# Patient Record
Sex: Female | Born: 1965 | Race: Black or African American | Hispanic: No | Marital: Married | Smoking: Never smoker
Health system: Southern US, Community
[De-identification: ages and names within clinical notes are randomized; demographics above are authoritative.]

## PROBLEM LIST (undated history)

## (undated) DIAGNOSIS — K219 Gastro-esophageal reflux disease without esophagitis: Secondary | ICD-10-CM

## (undated) DIAGNOSIS — J45909 Unspecified asthma, uncomplicated: Secondary | ICD-10-CM

## (undated) DIAGNOSIS — T7840XA Allergy, unspecified, initial encounter: Secondary | ICD-10-CM

## (undated) DIAGNOSIS — I10 Essential (primary) hypertension: Secondary | ICD-10-CM

## (undated) DIAGNOSIS — E041 Nontoxic single thyroid nodule: Secondary | ICD-10-CM

## (undated) DIAGNOSIS — D219 Benign neoplasm of connective and other soft tissue, unspecified: Secondary | ICD-10-CM

## (undated) HISTORY — DX: Essential (primary) hypertension: I10

## (undated) HISTORY — DX: Unspecified asthma, uncomplicated: J45.909

## (undated) HISTORY — DX: Benign neoplasm of connective and other soft tissue, unspecified: D21.9

## (undated) HISTORY — DX: Allergy, unspecified, initial encounter: T78.40XA

---

## 2011-04-30 ENCOUNTER — Ambulatory Visit (INDEPENDENT_AMBULATORY_CARE_PROVIDER_SITE_OTHER): Payer: Managed Care, Other (non HMO)

## 2011-04-30 DIAGNOSIS — R1084 Generalized abdominal pain: Secondary | ICD-10-CM

## 2011-04-30 DIAGNOSIS — K5732 Diverticulitis of large intestine without perforation or abscess without bleeding: Secondary | ICD-10-CM

## 2011-05-23 ENCOUNTER — Other Ambulatory Visit: Payer: Self-pay | Admitting: Family Medicine

## 2011-05-23 MED ORDER — LEVOCETIRIZINE DIHYDROCHLORIDE 5 MG PO TABS: 5.0000 mg | ORAL_TABLET | Freq: Every evening | ORAL | Status: DC

## 2011-05-23 MED ORDER — VALSARTAN-HYDROCHLOROTHIAZIDE 160-25 MG PO TABS: 1.0000 | ORAL_TABLET | Freq: Every day | ORAL | Status: DC

## 2011-05-24 ENCOUNTER — Telehealth: Payer: Self-pay | Admitting: Physician Assistant

## 2011-05-24 MED ORDER — VALSARTAN-HYDROCHLOROTHIAZIDE 160-25 MG PO TABS: 1.0000 | ORAL_TABLET | Freq: Every day | ORAL | Status: DC

## 2011-05-24 NOTE — Telephone Encounter (Signed)
prescription

## 2011-05-25 ENCOUNTER — Ambulatory Visit (INDEPENDENT_AMBULATORY_CARE_PROVIDER_SITE_OTHER): Payer: Managed Care, Other (non HMO) | Admitting: Family Medicine

## 2011-05-25 ENCOUNTER — Encounter: Payer: Self-pay | Admitting: Family Medicine

## 2011-05-25 DIAGNOSIS — N390 Urinary tract infection, site not specified: Secondary | ICD-10-CM

## 2011-05-25 DIAGNOSIS — T753XXA Motion sickness, initial encounter: Secondary | ICD-10-CM

## 2011-05-25 DIAGNOSIS — H6090 Unspecified otitis externa, unspecified ear: Secondary | ICD-10-CM

## 2011-05-25 DIAGNOSIS — R350 Frequency of micturition: Secondary | ICD-10-CM

## 2011-05-25 DIAGNOSIS — H833X9 Noise effects on inner ear, unspecified ear: Secondary | ICD-10-CM

## 2011-05-25 LAB — POCT URINALYSIS DIPSTICK
Ketones, UA: NEGATIVE
Protein, UA: NEGATIVE
Spec Grav, UA: 1.025
pH, UA: 5.5

## 2011-05-25 LAB — POCT UA - MICROSCOPIC ONLY
Casts, Ur, LPF, POC: NEGATIVE
Mucus, UA: NEGATIVE
Yeast, UA: NEGATIVE

## 2011-05-25 MED ORDER — SCOPOLAMINE 1 MG/3DAYS TD PT72: 1.0000 | MEDICATED_PATCH | TRANSDERMAL | Status: AC

## 2011-05-25 MED ORDER — NITROFURANTOIN MONOHYD MACRO 100 MG PO CAPS: 100.0000 mg | ORAL_CAPSULE | Freq: Two times a day (BID) | ORAL | Status: AC

## 2011-05-25 MED ORDER — CIPROFLOXACIN-HYDROCORTISONE 0.2-1 % OT SUSP: 3.0000 [drp] | Freq: Two times a day (BID) | OTIC | Status: AC

## 2011-05-25 NOTE — Progress Notes (Signed)
  Subjective:    Patient ID: Dorothy Baker, female    DOB: [birth year], 46 y.o.   MRN: [id number]  Urinary Tract Infection  This is a new problem. The current episode started in the past 7 days. The problem occurs intermittently. The problem has been gradually worsening. The quality of the pain is described as aching. The pain is mild. Associated symptoms include frequency. Pertinent negatives include no chills, discharge, flank pain, hematuria, hesitancy, nausea, sweats, urgency or vomiting. She has tried nothing for the symptoms. The treatment provided no relief. There is no history of recurrent UTIs.  Otalgia  There is pain in the left ear. This is a new problem. The current episode started in the past 7 days. The problem occurs every few minutes. The problem has been unchanged. There has been no fever. The fever has been present for 3 to 4 days. The pain is mild. Associated symptoms include hearing loss. Pertinent negatives include no abdominal pain, coughing, diarrhea, drainage, ear discharge, headaches, neck pain, rash, rhinorrhea, sore throat or vomiting.   Recent URI   Review of Systems  Constitutional: Negative for chills.  HENT: Positive for hearing loss and ear pain. Negative for sore throat, rhinorrhea, neck pain and ear discharge.   Respiratory: Negative for cough.   Gastrointestinal: Negative for nausea, vomiting, abdominal pain and diarrhea.  Genitourinary: Positive for frequency. Negative for hesitancy, urgency, hematuria and flank pain.  Skin: Negative for rash.  Neurological: Negative for headaches.       Objective:   Physical Exam  Constitutional: She is oriented to person, place, and time. She appears well-developed and well-nourished.  HENT:  Head: Normocephalic.       Left external ear canal is narrowed, inflammed. Pt able to pass whisper test  Eyes: EOM are normal. Pupils are equal, round, and reactive to light.  Neck: Normal range of motion. Neck supple.    Cardiovascular: Normal rate, regular rhythm and normal heart sounds.   Pulmonary/Chest: Effort normal and breath sounds normal.  Abdominal: Soft. Bowel sounds are normal.  Musculoskeletal: Normal range of motion.  Neurological: She is alert and oriented to person, place, and time.  Skin: Skin is warm.          Assessment & Plan:  1. UTI- urine cx pending, + leuks, - nitrites, Rx Macrobid 2. Left otitis externa-Pt has been [utting frops in ear canal and now has more pressure and narrowing of external canal. Gave her Cipro HC drops in Left ear.  3. Motion sickness-going on cruise will give rx for scopolamine patch.

## 2011-05-27 LAB — URINE CULTURE: Colony Count: 15000

## 2011-10-07 ENCOUNTER — Ambulatory Visit: Payer: Managed Care, Other (non HMO)

## 2011-10-23 ENCOUNTER — Other Ambulatory Visit: Payer: Self-pay | Admitting: Physician Assistant

## 2011-10-23 MED ORDER — LEVOCETIRIZINE DIHYDROCHLORIDE 5 MG PO TABS: 5.0000 mg | ORAL_TABLET | Freq: Every evening | ORAL | Status: DC

## 2012-03-12 ENCOUNTER — Other Ambulatory Visit: Payer: Self-pay | Admitting: Family Medicine

## 2012-03-12 DIAGNOSIS — Z1231 Encounter for screening mammogram for malignant neoplasm of breast: Secondary | ICD-10-CM

## 2012-03-31 ENCOUNTER — Ambulatory Visit
Admission: RE | Admit: 2012-03-31 | Discharge: 2012-03-31 | Disposition: A | Discharge status: Home / Self Care | Payer: Managed Care, Other (non HMO) | Source: Ambulatory Visit | Attending: Family Medicine | Admitting: Family Medicine

## 2012-03-31 DIAGNOSIS — Z1231 Encounter for screening mammogram for malignant neoplasm of breast: Secondary | ICD-10-CM

## 2014-12-24 DIAGNOSIS — J4550 Severe persistent asthma, uncomplicated: Secondary | ICD-10-CM | POA: Insufficient documentation

## 2014-12-24 DIAGNOSIS — J339 Nasal polyp, unspecified: Secondary | ICD-10-CM | POA: Insufficient documentation

## 2014-12-24 DIAGNOSIS — J309 Allergic rhinitis, unspecified: Secondary | ICD-10-CM | POA: Insufficient documentation

## 2014-12-24 DIAGNOSIS — H1010 Acute atopic conjunctivitis, unspecified eye: Secondary | ICD-10-CM | POA: Insufficient documentation

## 2014-12-24 DIAGNOSIS — J310 Chronic rhinitis: Secondary | ICD-10-CM | POA: Insufficient documentation

## 2014-12-24 DIAGNOSIS — T7800XA Anaphylactic reaction due to unspecified food, initial encounter: Secondary | ICD-10-CM | POA: Insufficient documentation

## 2014-12-30 ENCOUNTER — Other Ambulatory Visit: Payer: Self-pay

## 2014-12-30 MED ORDER — OMALIZUMAB 150 MG ~~LOC~~ SOLR
375.0000 mg | SUBCUTANEOUS | Status: DC
  Administered 2015-01-19 – 2021-11-05 (×159): 375 mg via SUBCUTANEOUS

## 2015-01-19 ENCOUNTER — Ambulatory Visit (INDEPENDENT_AMBULATORY_CARE_PROVIDER_SITE_OTHER): Payer: Managed Care, Other (non HMO)

## 2015-01-19 DIAGNOSIS — J4550 Severe persistent asthma, uncomplicated: Secondary | ICD-10-CM

## 2015-02-03 ENCOUNTER — Encounter: Payer: Managed Care, Other (non HMO) | Admitting: Neurology

## 2015-02-03 NOTE — Progress Notes (Signed)
This encounter was created in error - please disregard.

## 2015-02-16 ENCOUNTER — Ambulatory Visit (INDEPENDENT_AMBULATORY_CARE_PROVIDER_SITE_OTHER): Payer: Managed Care, Other (non HMO)

## 2015-02-16 DIAGNOSIS — J4540 Moderate persistent asthma, uncomplicated: Secondary | ICD-10-CM | POA: Diagnosis not present

## 2015-03-02 ENCOUNTER — Ambulatory Visit (INDEPENDENT_AMBULATORY_CARE_PROVIDER_SITE_OTHER): Payer: Managed Care, Other (non HMO)

## 2015-03-02 DIAGNOSIS — J4540 Moderate persistent asthma, uncomplicated: Secondary | ICD-10-CM

## 2015-03-14 ENCOUNTER — Ambulatory Visit (INDEPENDENT_AMBULATORY_CARE_PROVIDER_SITE_OTHER): Payer: Managed Care, Other (non HMO) | Admitting: *Deleted

## 2015-03-14 DIAGNOSIS — J4540 Moderate persistent asthma, uncomplicated: Secondary | ICD-10-CM

## 2015-03-20 ENCOUNTER — Other Ambulatory Visit: Payer: Self-pay | Admitting: Neurology

## 2015-03-20 MED ORDER — ALBUTEROL SULFATE HFA 108 (90 BASE) MCG/ACT IN AERS: 2.0000 | INHALATION_SPRAY | Freq: Four times a day (QID) | RESPIRATORY_TRACT | Status: DC | PRN

## 2015-03-21 ENCOUNTER — Other Ambulatory Visit: Payer: Self-pay

## 2015-03-21 MED ORDER — ALBUTEROL SULFATE HFA 108 (90 BASE) MCG/ACT IN AERS: 2.0000 | INHALATION_SPRAY | Freq: Four times a day (QID) | RESPIRATORY_TRACT | Status: DC | PRN

## 2015-03-22 ENCOUNTER — Other Ambulatory Visit: Payer: Self-pay | Admitting: Neurology

## 2015-03-22 MED ORDER — MONTELUKAST SODIUM 10 MG PO TABS: 10.0000 mg | ORAL_TABLET | Freq: Every day | ORAL | Status: DC

## 2015-03-23 ENCOUNTER — Other Ambulatory Visit: Payer: Self-pay | Admitting: Neurology

## 2015-03-23 MED ORDER — MONTELUKAST SODIUM 10 MG PO TABS: 10.0000 mg | ORAL_TABLET | Freq: Every day | ORAL | Status: DC

## 2015-03-24 ENCOUNTER — Other Ambulatory Visit: Payer: Self-pay | Admitting: Neurology

## 2015-03-24 MED ORDER — LEVOCETIRIZINE DIHYDROCHLORIDE 5 MG PO TABS: 5.0000 mg | ORAL_TABLET | Freq: Every evening | ORAL | Status: DC

## 2015-03-30 ENCOUNTER — Ambulatory Visit (INDEPENDENT_AMBULATORY_CARE_PROVIDER_SITE_OTHER): Payer: Managed Care, Other (non HMO)

## 2015-03-30 DIAGNOSIS — J4540 Moderate persistent asthma, uncomplicated: Secondary | ICD-10-CM

## 2015-04-13 ENCOUNTER — Ambulatory Visit (INDEPENDENT_AMBULATORY_CARE_PROVIDER_SITE_OTHER): Payer: Managed Care, Other (non HMO)

## 2015-04-13 DIAGNOSIS — J4540 Moderate persistent asthma, uncomplicated: Secondary | ICD-10-CM

## 2015-04-27 ENCOUNTER — Ambulatory Visit (INDEPENDENT_AMBULATORY_CARE_PROVIDER_SITE_OTHER): Payer: Managed Care, Other (non HMO)

## 2015-04-27 DIAGNOSIS — J4540 Moderate persistent asthma, uncomplicated: Secondary | ICD-10-CM

## 2015-05-11 ENCOUNTER — Ambulatory Visit (INDEPENDENT_AMBULATORY_CARE_PROVIDER_SITE_OTHER): Payer: Managed Care, Other (non HMO)

## 2015-05-11 DIAGNOSIS — J4540 Moderate persistent asthma, uncomplicated: Secondary | ICD-10-CM | POA: Diagnosis not present

## 2015-05-19 ENCOUNTER — Encounter: Payer: Self-pay | Admitting: Allergy and Immunology

## 2015-05-19 ENCOUNTER — Ambulatory Visit (INDEPENDENT_AMBULATORY_CARE_PROVIDER_SITE_OTHER): Payer: Managed Care, Other (non HMO) | Admitting: Allergy and Immunology

## 2015-05-19 VITALS — BP 120/80 | HR 76 | Temp 98.2°F | Resp 18

## 2015-05-19 DIAGNOSIS — H1010 Acute atopic conjunctivitis, unspecified eye: Secondary | ICD-10-CM

## 2015-05-19 DIAGNOSIS — J309 Allergic rhinitis, unspecified: Secondary | ICD-10-CM | POA: Diagnosis not present

## 2015-05-19 DIAGNOSIS — J4551 Severe persistent asthma with (acute) exacerbation: Secondary | ICD-10-CM | POA: Diagnosis not present

## 2015-05-19 MED ORDER — IPRATROPIUM BROMIDE 0.02 % IN SOLN
0.5000 mg | Freq: Once | RESPIRATORY_TRACT | Status: AC
  Administered 2015-05-19: 0.5 mg via RESPIRATORY_TRACT

## 2015-05-19 MED ORDER — LEVALBUTEROL HCL 1.25 MG/3ML IN NEBU
1.2500 mg | INHALATION_SOLUTION | Freq: Once | RESPIRATORY_TRACT | Status: AC
  Administered 2015-05-19: 1.25 mg via RESPIRATORY_TRACT

## 2015-05-19 MED ORDER — AEROCHAMBER PLUS MISC: Status: AC

## 2015-05-19 NOTE — Progress Notes (Signed)
FOLLOW UP NOTE  RE: Dorothy Baker MRN: [id number] DOB: Jun 02, 1965 [facility] [place] [address] [place] [address] Date of Office Visit: 05/19/2015  Subjective:  Dorothy Baker is a 50 y.o. female who presents today for Cough and Wheezing  Assessment:   1. Severe persistent asthma, with acute exacerbation, in no respiratory distress, responsive to bronchodilator.    2. Allergic rhinoconjunctivitis.   3.      History of nasal polyposis and aspirin hypersensitivity. 4.      Peanut allergy--avoidance and emergency action plan in place.   5.      Complex medical history. Plan:   Meds ordered this encounter  Medications  . ipratropium (ATROVENT) nebulizer solution 0.5 mg    Sig:   . levalbuterol (XOPENEX) nebulizer solution 1.25 mg    Sig:   . Spacer/Aero-Holding Chambers (AEROCHAMBER PLUS) inhaler    Sig: Use as directed with MDI    Dispense:  1 each    Refill:  2   Patient Instructions  1.  Begin prednisone  30mg  now and complete 30mg  daily over the next  3 days, then 20mg  for 2 days and 10mg  on last day. 2.  Use Symbicort 181mcg 2 Puffs twice daily with spacer. 3.  Ventolin HFA  2 puffs every 4 hours as needed--with spacer. 4.  Saline nasal wash 2-4 times daily. 5.  Continue Nasonex, Xyzal, Singulair daily. 6.  Xolair as scheduled. 7.  Epi-pen as needed. 8.  Call with any persisting symptoms or questions or concerns--reviewed 24-hour on-call physician availability throughout office. 9.  Follow-up in 4 Month(s) or sooner if needed.  HPI: Dorothy Baker returns to the office with report of cough and wheeze.  She reports her symptoms began in the last 2 days with slight increase today.  Therefore, today has used albuterol several times and notices chest congestion, tightness with increasing throat tickle, postnasal drip, and raspy voice.  She denies headache, sore throat or fever. Is sleeping very good and maintaining on her preventive regime  without issue, including Xolair one week ago.  There is mild nasal congestion, sneezing, but no discolored drainage or disrupted activity.   Denies ED or urgent care visits, prednisone or antibiotic courses. Reports sleep and activity are normal.  Dorothy Baker which includes the following prescription(s): albuterol, budesonide-formoterol, epinephrine, levocetirizine, mometasone, montelukast, omeprazole, sodium chloride, and valsartan-hydrochlorothiazide, and the following Facility-Administered Medications: omalizumab.   Drug Allergies: Allergies  Allergen Reactions  . Aspirin Shortness Of Breath and Cough    Asthma flair up  . Other Diarrhea and Nausea And Vomiting    Peanut  . Ciprofloxacin    Objective:   Filed Vitals:   05/19/15 1519  BP: 120/80  Pulse: 76  Temp: 98.2 F (36.8 C)  Resp: 18   SpO2 Readings from Last 1 Encounters:  05/19/15 95%   Physical Exam  Constitutional: She is well-developed, well-nourished, and in no distress.  Non-toxic appearance.  Alert interactive communicating easily in full sentences with intermittent throat clearing.  HENT:  Head: Atraumatic.  Right Ear: Tympanic membrane and ear canal normal.  Left Ear: Tympanic membrane and ear canal normal.  Nose: Mucosal edema and rhinorrhea (slight clear mucus bilaterally.) present. No epistaxis.  Mouth/Throat: Oropharynx is clear and moist and mucous membranes are normal. No oropharyngeal exudate, posterior oropharyngeal edema or posterior oropharyngeal erythema.  Neck: Neck supple.  Cardiovascular: Normal rate, S1 normal and S2 normal.   No murmur  heard. Pulmonary/Chest: Effort normal. No respiratory distress. She has wheezes (end expiratory wheeze with fair aeration.). She has no rhonchi. She has no rales.  Post Xopenex/Atrovent neb:  Improved aeration, without wheeze, rhonchi or crackles. Patient reports improved.    Lymphadenopathy:    She has no cervical adenopathy.    Diagnostics: Spirometry:  FVC 2.1 4--72% , FEV1 1.6 4--68% , FEF 25-75% 1.27-43%; significant improvement.  Postbronchodilator FVC 2.6 0--88%, FEV1 1.9 9--82 %, FEF 25--75% 1.55--52%.    Dorothy M. Ishmael Holter, MD  cc: Windell Hummingbird, PA-C

## 2015-05-19 NOTE — Patient Instructions (Addendum)
Begin prednisone  30mg  now and complete 30mg  daily over the next  3 days, then 20mg  for 2 days and 10mg  on last day.  Use  Symbicort 14mcg 2 Puffs twice daily.  Ventolin HFA  2 puffs every 4 hours as needed.  Saline nasal wash 2-4 times daily.  Continue Nasonex, Xyzal, Singulair daily.  Xolair as scheduled.  Epi-pen as needed.  Call with any persisting symptoms or questions or concerns.  Follow-up in  4 Month(s).

## 2015-05-25 ENCOUNTER — Ambulatory Visit (INDEPENDENT_AMBULATORY_CARE_PROVIDER_SITE_OTHER): Payer: Managed Care, Other (non HMO)

## 2015-05-25 DIAGNOSIS — J4540 Moderate persistent asthma, uncomplicated: Secondary | ICD-10-CM | POA: Diagnosis not present

## 2015-06-12 ENCOUNTER — Ambulatory Visit (INDEPENDENT_AMBULATORY_CARE_PROVIDER_SITE_OTHER): Payer: Managed Care, Other (non HMO)

## 2015-06-12 DIAGNOSIS — J4540 Moderate persistent asthma, uncomplicated: Secondary | ICD-10-CM | POA: Diagnosis not present

## 2015-06-29 ENCOUNTER — Ambulatory Visit (INDEPENDENT_AMBULATORY_CARE_PROVIDER_SITE_OTHER): Payer: Managed Care, Other (non HMO)

## 2015-06-29 DIAGNOSIS — J4540 Moderate persistent asthma, uncomplicated: Secondary | ICD-10-CM

## 2015-07-13 ENCOUNTER — Ambulatory Visit (INDEPENDENT_AMBULATORY_CARE_PROVIDER_SITE_OTHER): Payer: Managed Care, Other (non HMO)

## 2015-07-13 DIAGNOSIS — J4540 Moderate persistent asthma, uncomplicated: Secondary | ICD-10-CM

## 2015-07-27 ENCOUNTER — Ambulatory Visit (INDEPENDENT_AMBULATORY_CARE_PROVIDER_SITE_OTHER): Payer: Managed Care, Other (non HMO)

## 2015-07-27 DIAGNOSIS — J4540 Moderate persistent asthma, uncomplicated: Secondary | ICD-10-CM | POA: Diagnosis not present

## 2015-08-02 ENCOUNTER — Other Ambulatory Visit: Payer: Self-pay | Admitting: *Deleted

## 2015-08-02 MED ORDER — LEVOCETIRIZINE DIHYDROCHLORIDE 5 MG PO TABS: 5.0000 mg | ORAL_TABLET | Freq: Every evening | ORAL | Status: DC

## 2015-08-22 ENCOUNTER — Ambulatory Visit (INDEPENDENT_AMBULATORY_CARE_PROVIDER_SITE_OTHER): Payer: Managed Care, Other (non HMO)

## 2015-08-22 DIAGNOSIS — J4540 Moderate persistent asthma, uncomplicated: Secondary | ICD-10-CM | POA: Diagnosis not present

## 2015-08-24 ENCOUNTER — Other Ambulatory Visit: Payer: Self-pay

## 2015-08-24 DIAGNOSIS — Z1231 Encounter for screening mammogram for malignant neoplasm of breast: Secondary | ICD-10-CM

## 2015-09-08 ENCOUNTER — Ambulatory Visit (INDEPENDENT_AMBULATORY_CARE_PROVIDER_SITE_OTHER): Payer: Managed Care, Other (non HMO) | Admitting: *Deleted

## 2015-09-08 ENCOUNTER — Ambulatory Visit
Admission: RE | Admit: 2015-09-08 | Discharge: 2015-09-08 | Disposition: A | Discharge status: Home / Self Care | Payer: Managed Care, Other (non HMO) | Source: Ambulatory Visit

## 2015-09-08 DIAGNOSIS — J4540 Moderate persistent asthma, uncomplicated: Secondary | ICD-10-CM | POA: Diagnosis not present

## 2015-09-08 DIAGNOSIS — Z1231 Encounter for screening mammogram for malignant neoplasm of breast: Secondary | ICD-10-CM

## 2015-09-29 ENCOUNTER — Telehealth: Payer: Self-pay | Admitting: *Deleted

## 2015-09-29 ENCOUNTER — Ambulatory Visit (INDEPENDENT_AMBULATORY_CARE_PROVIDER_SITE_OTHER): Payer: Managed Care, Other (non HMO) | Admitting: *Deleted

## 2015-09-29 DIAGNOSIS — J4540 Moderate persistent asthma, uncomplicated: Secondary | ICD-10-CM | POA: Diagnosis not present

## 2015-09-29 NOTE — Telephone Encounter (Signed)
PAYING OFF MM BAL - THEN WILL START PAYING EPIC - SHE THINKS SHE PAID $500 USING FLEX CARD  - SHE WILL LET ME KNOW IF SHE FINDS IT

## 2015-09-29 NOTE — Telephone Encounter (Signed)
PT WOULD LIKE A RETURN CALL ABOUT HER BILL. PT STATES THAT SHE HAS MADE SEVERAL PAYMENTS THIS YEAR AND ITS NOT REFLECTING ON THE BILLS SHE KEEPS RECEIVING.

## 2015-10-12 ENCOUNTER — Ambulatory Visit (INDEPENDENT_AMBULATORY_CARE_PROVIDER_SITE_OTHER): Payer: Managed Care, Other (non HMO)

## 2015-10-12 DIAGNOSIS — J4540 Moderate persistent asthma, uncomplicated: Secondary | ICD-10-CM

## 2015-10-12 DIAGNOSIS — J309 Allergic rhinitis, unspecified: Secondary | ICD-10-CM

## 2015-10-26 ENCOUNTER — Other Ambulatory Visit: Payer: Self-pay | Admitting: *Deleted

## 2015-10-26 ENCOUNTER — Ambulatory Visit (INDEPENDENT_AMBULATORY_CARE_PROVIDER_SITE_OTHER): Payer: Managed Care, Other (non HMO)

## 2015-10-26 DIAGNOSIS — J4540 Moderate persistent asthma, uncomplicated: Secondary | ICD-10-CM

## 2015-10-26 MED ORDER — BUDESONIDE-FORMOTEROL FUMARATE 160-4.5 MCG/ACT IN AERO: 2.0000 | INHALATION_SPRAY | Freq: Two times a day (BID) | RESPIRATORY_TRACT | Status: DC

## 2015-11-20 ENCOUNTER — Ambulatory Visit (INDEPENDENT_AMBULATORY_CARE_PROVIDER_SITE_OTHER): Payer: Managed Care, Other (non HMO)

## 2015-11-20 DIAGNOSIS — J4540 Moderate persistent asthma, uncomplicated: Secondary | ICD-10-CM

## 2015-12-07 ENCOUNTER — Ambulatory Visit (INDEPENDENT_AMBULATORY_CARE_PROVIDER_SITE_OTHER): Payer: Managed Care, Other (non HMO)

## 2015-12-07 ENCOUNTER — Other Ambulatory Visit: Payer: Self-pay | Admitting: *Deleted

## 2015-12-07 DIAGNOSIS — J4540 Moderate persistent asthma, uncomplicated: Secondary | ICD-10-CM

## 2015-12-07 MED ORDER — MONTELUKAST SODIUM 10 MG PO TABS: 10.0000 mg | ORAL_TABLET | Freq: Every day | ORAL | 2 refills | Status: DC

## 2015-12-07 MED ORDER — LEVOCETIRIZINE DIHYDROCHLORIDE 5 MG PO TABS: 5.0000 mg | ORAL_TABLET | Freq: Every evening | ORAL | 3 refills | Status: DC

## 2015-12-21 ENCOUNTER — Ambulatory Visit: Payer: Self-pay

## 2015-12-28 ENCOUNTER — Ambulatory Visit (INDEPENDENT_AMBULATORY_CARE_PROVIDER_SITE_OTHER): Payer: Managed Care, Other (non HMO)

## 2015-12-28 DIAGNOSIS — J4540 Moderate persistent asthma, uncomplicated: Secondary | ICD-10-CM

## 2016-01-11 ENCOUNTER — Ambulatory Visit (INDEPENDENT_AMBULATORY_CARE_PROVIDER_SITE_OTHER): Payer: Managed Care, Other (non HMO)

## 2016-01-11 ENCOUNTER — Ambulatory Visit: Payer: Managed Care, Other (non HMO)

## 2016-01-11 DIAGNOSIS — J4540 Moderate persistent asthma, uncomplicated: Secondary | ICD-10-CM

## 2016-01-25 ENCOUNTER — Ambulatory Visit (INDEPENDENT_AMBULATORY_CARE_PROVIDER_SITE_OTHER): Payer: Managed Care, Other (non HMO) | Admitting: *Deleted

## 2016-01-25 DIAGNOSIS — J4540 Moderate persistent asthma, uncomplicated: Secondary | ICD-10-CM

## 2016-02-15 ENCOUNTER — Ambulatory Visit (INDEPENDENT_AMBULATORY_CARE_PROVIDER_SITE_OTHER): Payer: Managed Care, Other (non HMO) | Admitting: *Deleted

## 2016-02-15 DIAGNOSIS — J4540 Moderate persistent asthma, uncomplicated: Secondary | ICD-10-CM

## 2016-02-28 ENCOUNTER — Ambulatory Visit (INDEPENDENT_AMBULATORY_CARE_PROVIDER_SITE_OTHER): Payer: Managed Care, Other (non HMO) | Admitting: *Deleted

## 2016-02-28 DIAGNOSIS — J4540 Moderate persistent asthma, uncomplicated: Secondary | ICD-10-CM | POA: Diagnosis not present

## 2016-03-13 ENCOUNTER — Ambulatory Visit (INDEPENDENT_AMBULATORY_CARE_PROVIDER_SITE_OTHER): Payer: Managed Care, Other (non HMO) | Admitting: *Deleted

## 2016-03-13 DIAGNOSIS — J4540 Moderate persistent asthma, uncomplicated: Secondary | ICD-10-CM | POA: Diagnosis not present

## 2016-03-27 ENCOUNTER — Ambulatory Visit (INDEPENDENT_AMBULATORY_CARE_PROVIDER_SITE_OTHER): Payer: Managed Care, Other (non HMO) | Admitting: *Deleted

## 2016-03-27 DIAGNOSIS — J4540 Moderate persistent asthma, uncomplicated: Secondary | ICD-10-CM | POA: Diagnosis not present

## 2016-04-10 ENCOUNTER — Ambulatory Visit: Payer: Managed Care, Other (non HMO)

## 2016-04-11 ENCOUNTER — Ambulatory Visit (INDEPENDENT_AMBULATORY_CARE_PROVIDER_SITE_OTHER): Payer: Managed Care, Other (non HMO) | Admitting: *Deleted

## 2016-04-11 DIAGNOSIS — J4540 Moderate persistent asthma, uncomplicated: Secondary | ICD-10-CM

## 2016-04-24 ENCOUNTER — Ambulatory Visit (INDEPENDENT_AMBULATORY_CARE_PROVIDER_SITE_OTHER): Payer: Managed Care, Other (non HMO)

## 2016-04-24 ENCOUNTER — Ambulatory Visit: Payer: Managed Care, Other (non HMO)

## 2016-04-24 DIAGNOSIS — J4540 Moderate persistent asthma, uncomplicated: Secondary | ICD-10-CM | POA: Diagnosis not present

## 2016-05-10 ENCOUNTER — Ambulatory Visit (INDEPENDENT_AMBULATORY_CARE_PROVIDER_SITE_OTHER): Payer: Managed Care, Other (non HMO)

## 2016-05-10 DIAGNOSIS — J4540 Moderate persistent asthma, uncomplicated: Secondary | ICD-10-CM

## 2016-05-23 ENCOUNTER — Ambulatory Visit (INDEPENDENT_AMBULATORY_CARE_PROVIDER_SITE_OTHER): Payer: Managed Care, Other (non HMO)

## 2016-05-23 ENCOUNTER — Ambulatory Visit: Payer: Managed Care, Other (non HMO)

## 2016-05-23 DIAGNOSIS — J4540 Moderate persistent asthma, uncomplicated: Secondary | ICD-10-CM | POA: Diagnosis not present

## 2016-06-06 ENCOUNTER — Encounter: Payer: Self-pay | Admitting: Allergy

## 2016-06-06 ENCOUNTER — Ambulatory Visit (INDEPENDENT_AMBULATORY_CARE_PROVIDER_SITE_OTHER): Payer: Managed Care, Other (non HMO) | Admitting: Allergy

## 2016-06-06 VITALS — BP 110/68 | HR 70 | Temp 98.3°F | Resp 16 | Ht 65.5 in | Wt 225.2 lb

## 2016-06-06 DIAGNOSIS — J309 Allergic rhinitis, unspecified: Secondary | ICD-10-CM

## 2016-06-06 DIAGNOSIS — H6981 Other specified disorders of Eustachian tube, right ear: Secondary | ICD-10-CM

## 2016-06-06 DIAGNOSIS — H1010 Acute atopic conjunctivitis, unspecified eye: Secondary | ICD-10-CM

## 2016-06-06 DIAGNOSIS — J4550 Severe persistent asthma, uncomplicated: Secondary | ICD-10-CM

## 2016-06-06 MED ORDER — MOMETASONE FUROATE 50 MCG/ACT NA SUSP: 1.0000 | Freq: Two times a day (BID) | NASAL | 2 refills | Status: DC

## 2016-06-06 NOTE — Patient Instructions (Signed)
Eustachian tube dysfunction    - ear exam is normal without evidence of infection or fluid    - take prednisone 5 day course prescribed    - keep follow-up with Dr. Redmond Baseman  Asthma    - Continue Symbicort 110mcg 2 Puffs twice daily.    - Ventolin HFA  2 puffs every 4 hours as needed.    - Xolair every 2 weeks          - Epipen as needed   Asthma control goals:   Full participation in all desired activities (may need albuterol before activity)  Albuterol use two time or less a week on average (not counting use with activity)  Cough interfering with sleep two time or less a month  Oral steroids no more than once a year  No hospitalizations  Allergies   - Saline nasal wash 2-4 times daily as needed.   - Continue Nasonex, Xyzal, Singulair daily.   Call with any persisting symptoms or questions or concerns.  Follow-up in  6 Month(s).

## 2016-06-06 NOTE — Progress Notes (Signed)
Follow-up Note  RE: Dorothy Baker MRN: [id number] DOB: [birth year] Date of Office Visit: 06/06/2016   History of present illness: Dorothy Baker is a 51 y.o. female presenting today for follow-up of severe persistent asthma, allergic rhinoconjunctivitis, polyposis with aspirin hypersensitivity and food allergy. She was last seen in the office on October 17, 2015 by Dr. Ishmael Holter.  At that visit she had an acute exacerbation and treated with steroids.   Since this visit she has done well.  She has not had any further exacerbations, no oral steroids, no ED/UC visits or hospitalizations.  No nighttime awakenings. She gets Xolair injection every 2 weeks and does not have any local or systemic reactions.  She has access to Epipen.   She has noticed a big improvement on Xolair. She states she used to get 2-3 exacerbations requiring ED visits and/or hospitalizations; she has not had any hospitalizations since going on Xolair.    She presents today mostly as her R ear is clogged ut denies any chearing.  She has had this occur in the past and was advised by Dr. Redmond Baseman, her ENT, to increase her nasonex.  She was unable to get in to see Dr. Redmond Baseman until 06/13/15.  She started back using her Nasonex 2 sprays twice a day as recommended by Dr. Redmond Baseman. She reports she was doing just 1 spray twice a day.  She did report she ran out of her the nasonex and thinks that might be reason her ear is right clogged.  He denies any worsening of smell and she is able to taste.  She had polyps removed with Dr. Redmond Baseman several years ago who prescribed nasonex for her to use.  She saw him in follow-up about 1 year ago. .    Review of systems: Review of Systems  Constitutional: Negative for chills, fever and malaise/fatigue.  HENT: Negative for congestion, ear discharge, ear pain, hearing loss, sinus pain, sore throat and tinnitus.   Eyes: Negative for discharge and redness.  Respiratory: Negative for cough, shortness of breath and  wheezing.   Cardiovascular: Negative for chest pain.  Gastrointestinal: Negative for abdominal pain, heartburn, nausea and vomiting.  Musculoskeletal: Negative for joint pain and myalgias.  Skin: Negative for itching and rash.  Neurological: Negative for headaches.    All other systems negative unless noted above in HPI  Past medical/social/surgical/family history have been reviewed and are unchanged unless specifically indicated below.  No changes  Medication List: Allergies as of 06/06/2016      Reactions   Aspirin Shortness Of Breath, Cough   Asthma flair up   Other Diarrhea, Nausea And Vomiting   Peanut   Ciprofloxacin       Medication List       Accurate as of 06/06/16  4:46 PM. Always use your most recent med list.          AEROCHAMBER PLUS inhaler Use as directed with MDI   albuterol 108 (90 Base) MCG/ACT inhaler Commonly known as:  VENTOLIN HFA Inhale 2 puffs into the lungs every 6 (six) hours as needed for wheezing or shortness of breath.   amitriptyline 25 MG tablet Commonly known as:  ELAVIL Take 25 mg by mouth as needed.   budesonide-formoterol 160-4.5 MCG/ACT inhaler Commonly known as:  SYMBICORT Inhale 2 puffs into the lungs 2 (two) times daily.   EPIPEN 2-PAK 0.3 mg/0.3 mL Soaj injection Generic drug:  EPINEPHrine Inject 0.3 mg into the muscle once.   levocetirizine 5 MG tablet  Commonly known as:  XYZAL Take 1 tablet (5 mg total) by mouth every evening.   metFORMIN 1000 MG tablet Commonly known as:  GLUCOPHAGE Take 1,000 mg by mouth 2 (two) times daily with a meal. Reported on 05/19/2015   mometasone 50 MCG/ACT nasal spray Commonly known as:  NASONEX Place 1-2 sprays into the nose 2 (two) times daily.   montelukast 10 MG tablet Commonly known as:  SINGULAIR Take 1 tablet (10 mg total) by mouth at bedtime.   omeprazole 20 MG capsule Commonly known as:  PRILOSEC Take 20 mg by mouth daily.   phentermine 37.5 MG tablet Commonly known as:   ADIPEX-P Take 37.5 mg by mouth daily.   sodium chloride 0.65 % Soln nasal spray Commonly known as:  OCEAN Place 1 spray into both nostrils as needed for congestion.   valsartan-hydrochlorothiazide 160-25 MG tablet Commonly known as:  DIOVAN HCT Take 1 tablet by mouth daily.   valsartan-hydrochlorothiazide 160-12.5 MG tablet Commonly known as:  DIOVAN-HCT Take 160 tablets by mouth daily.   Vitamin D3 5000 units Caps Take 5,000 Units by mouth daily.       Known medication allergies: Allergies  Allergen Reactions  . Aspirin Shortness Of Breath and Cough    Asthma flair up  . Other Diarrhea and Nausea And Vomiting    Peanut  . Ciprofloxacin      Physical examination: Blood pressure 110/68, pulse 70, temperature 98.3 F (36.8 C), temperature source Oral, resp. rate 16, height 5' 5.5" (1.664 m), weight 225 lb 3.2 oz (102.2 kg), SpO2 98 %.  General: Alert, interactive, in no acute distress. HEENT: TMs pearly gray, no erythema of the canals and no evidence of fluid behind the Tm, turbinates mildly edematous without discharge, post-pharynx non erythematous. Neck: Supple without lymphadenopathy. Lungs: Clear to auscultation without wheezing, rhonchi or rales. {no increased work of breathing. CV: Normal S1, S2 without murmurs. Abdomen: Nondistended, nontender. Skin: Warm and dry, without lesions or rashes. Extremities:  No clubbing, cyanosis or edema. Neuro:   Grossly intact.  Diagnositics/Labs:  Spirometry: FEV1: 1.94L  83%, FVC: 3.1L  108%, ratio consistent with Nonobstructive pattern  Assessment and plan:   Eustachian tube dysfunction    - ear exam is normal without evidence of infection or fluid    - take prednisone 5 day course prescribed    - keep follow-up with Dr. Redmond Baseman  Asthma,  Severe persistent    - Continue Symbicort 140mcg 2 Puffs twice daily.    - Ventolin HFA  2 puffs every 4 hours as needed.    - Xolair every 2 weeks          - Epipen as needed    Asthma control goals:   Full participation in all desired activities (may need albuterol before activity)  Albuterol use two time or less a week on average (not counting use with activity)  Cough interfering with sleep two time or less a month  Oral steroids no more than once a year  No hospitalizations  Allergic rhiconjunctivitis   - Saline nasal wash 2-4 times daily as needed.   - Continue Nasonex, Xyzal, Singulair daily.   Call with any persisting symptoms or questions or concerns.  Follow-up in  6 Month(s).    No Follow-up on file.  I appreciate the opportunity to take part in Dorothy Baker's care. Please do not hesitate to contact me with questions.  Sincerely,   Prudy Feeler, MD Allergy/Immunology [facility]

## 2016-06-17 ENCOUNTER — Ambulatory Visit: Payer: Self-pay

## 2016-06-19 ENCOUNTER — Telehealth: Payer: Self-pay | Admitting: Allergy and Immunology

## 2016-06-19 ENCOUNTER — Ambulatory Visit (INDEPENDENT_AMBULATORY_CARE_PROVIDER_SITE_OTHER): Payer: Managed Care, Other (non HMO)

## 2016-06-19 DIAGNOSIS — J4550 Severe persistent asthma, uncomplicated: Secondary | ICD-10-CM

## 2016-06-19 NOTE — Telephone Encounter (Signed)
Pt came to pay on her account and was told it was $662.45 and wanted to know why so high after she has paid over $400.00 and she would like to get a print out and a phone call [phone]. Sherryle Lis [phone].

## 2016-06-19 NOTE — Telephone Encounter (Signed)
Pt's account has some ins credits - will contact [facility] billing & call pt tomorrow

## 2016-06-20 ENCOUNTER — Ambulatory Visit: Payer: Self-pay

## 2016-06-24 NOTE — Telephone Encounter (Signed)
Dorothy Baker in [place] billing fixed account - called pt & told her that her correct bal is $362.45

## 2016-07-03 ENCOUNTER — Ambulatory Visit: Payer: Managed Care, Other (non HMO)

## 2016-07-04 ENCOUNTER — Ambulatory Visit (INDEPENDENT_AMBULATORY_CARE_PROVIDER_SITE_OTHER): Payer: Managed Care, Other (non HMO) | Admitting: *Deleted

## 2016-07-04 ENCOUNTER — Ambulatory Visit: Payer: Managed Care, Other (non HMO)

## 2016-07-04 DIAGNOSIS — J4550 Severe persistent asthma, uncomplicated: Secondary | ICD-10-CM | POA: Diagnosis not present

## 2016-07-18 ENCOUNTER — Ambulatory Visit (INDEPENDENT_AMBULATORY_CARE_PROVIDER_SITE_OTHER): Payer: Managed Care, Other (non HMO) | Admitting: *Deleted

## 2016-07-18 DIAGNOSIS — J4540 Moderate persistent asthma, uncomplicated: Secondary | ICD-10-CM

## 2016-08-01 ENCOUNTER — Ambulatory Visit (INDEPENDENT_AMBULATORY_CARE_PROVIDER_SITE_OTHER): Payer: Managed Care, Other (non HMO) | Admitting: *Deleted

## 2016-08-01 DIAGNOSIS — J4540 Moderate persistent asthma, uncomplicated: Secondary | ICD-10-CM | POA: Diagnosis not present

## 2016-08-14 ENCOUNTER — Telehealth: Payer: Self-pay | Admitting: Allergy

## 2016-08-14 ENCOUNTER — Ambulatory Visit (INDEPENDENT_AMBULATORY_CARE_PROVIDER_SITE_OTHER): Payer: Managed Care, Other (non HMO) | Admitting: *Deleted

## 2016-08-14 DIAGNOSIS — J4540 Moderate persistent asthma, uncomplicated: Secondary | ICD-10-CM

## 2016-08-14 NOTE — Telephone Encounter (Signed)
patient would like a print out for the past year of the bills and payments she has made. please mail to the patient

## 2016-08-14 NOTE — Telephone Encounter (Signed)
Danielle will send printout to pt - kt

## 2016-08-28 ENCOUNTER — Ambulatory Visit: Payer: Managed Care, Other (non HMO)

## 2016-08-29 ENCOUNTER — Ambulatory Visit (INDEPENDENT_AMBULATORY_CARE_PROVIDER_SITE_OTHER): Payer: Managed Care, Other (non HMO) | Admitting: *Deleted

## 2016-08-29 DIAGNOSIS — J4540 Moderate persistent asthma, uncomplicated: Secondary | ICD-10-CM

## 2016-08-30 ENCOUNTER — Other Ambulatory Visit: Payer: Self-pay | Admitting: *Deleted

## 2016-08-30 DIAGNOSIS — Z1231 Encounter for screening mammogram for malignant neoplasm of breast: Secondary | ICD-10-CM

## 2016-09-12 ENCOUNTER — Ambulatory Visit (INDEPENDENT_AMBULATORY_CARE_PROVIDER_SITE_OTHER): Payer: Managed Care, Other (non HMO) | Admitting: *Deleted

## 2016-09-12 DIAGNOSIS — J4540 Moderate persistent asthma, uncomplicated: Secondary | ICD-10-CM | POA: Diagnosis not present

## 2016-09-13 ENCOUNTER — Ambulatory Visit
Admission: RE | Admit: 2016-09-13 | Discharge: 2016-09-13 | Disposition: A | Discharge status: Home / Self Care | Payer: Managed Care, Other (non HMO) | Source: Ambulatory Visit | Attending: *Deleted | Admitting: *Deleted

## 2016-09-13 DIAGNOSIS — Z1231 Encounter for screening mammogram for malignant neoplasm of breast: Secondary | ICD-10-CM

## 2016-09-26 ENCOUNTER — Ambulatory Visit (INDEPENDENT_AMBULATORY_CARE_PROVIDER_SITE_OTHER): Payer: Managed Care, Other (non HMO) | Admitting: *Deleted

## 2016-09-26 DIAGNOSIS — J4540 Moderate persistent asthma, uncomplicated: Secondary | ICD-10-CM | POA: Diagnosis not present

## 2016-10-10 ENCOUNTER — Ambulatory Visit (INDEPENDENT_AMBULATORY_CARE_PROVIDER_SITE_OTHER): Payer: Managed Care, Other (non HMO)

## 2016-10-10 DIAGNOSIS — J4540 Moderate persistent asthma, uncomplicated: Secondary | ICD-10-CM | POA: Diagnosis not present

## 2016-10-18 ENCOUNTER — Other Ambulatory Visit: Payer: Self-pay

## 2016-10-18 MED ORDER — MONTELUKAST SODIUM 10 MG PO TABS: 10.0000 mg | ORAL_TABLET | Freq: Every day | ORAL | 2 refills | Status: DC

## 2016-10-24 ENCOUNTER — Ambulatory Visit: Payer: Self-pay

## 2016-10-28 ENCOUNTER — Ambulatory Visit (INDEPENDENT_AMBULATORY_CARE_PROVIDER_SITE_OTHER): Payer: Managed Care, Other (non HMO) | Admitting: Allergy & Immunology

## 2016-10-28 ENCOUNTER — Ambulatory Visit (INDEPENDENT_AMBULATORY_CARE_PROVIDER_SITE_OTHER): Payer: Managed Care, Other (non HMO)

## 2016-10-28 ENCOUNTER — Encounter: Payer: Self-pay | Admitting: Allergy & Immunology

## 2016-10-28 VITALS — BP 112/76 | HR 75 | Temp 98.7°F

## 2016-10-28 DIAGNOSIS — J4540 Moderate persistent asthma, uncomplicated: Secondary | ICD-10-CM

## 2016-10-28 DIAGNOSIS — H938X1 Other specified disorders of right ear: Secondary | ICD-10-CM | POA: Diagnosis not present

## 2016-10-28 DIAGNOSIS — J4550 Severe persistent asthma, uncomplicated: Secondary | ICD-10-CM

## 2016-10-28 DIAGNOSIS — H1010 Acute atopic conjunctivitis, unspecified eye: Secondary | ICD-10-CM

## 2016-10-28 DIAGNOSIS — H6981 Other specified disorders of Eustachian tube, right ear: Secondary | ICD-10-CM | POA: Diagnosis not present

## 2016-10-28 DIAGNOSIS — J309 Allergic rhinitis, unspecified: Secondary | ICD-10-CM

## 2016-10-28 MED ORDER — MOMETASONE FUROATE 50 MCG/ACT NA SUSP: 1.0000 | Freq: Two times a day (BID) | NASAL | 3 refills | Status: DC

## 2016-10-28 MED ORDER — OXYMETAZOLINE HCL 0.05 % NA SOLN: 1.0000 | Freq: Two times a day (BID) | NASAL | 3 refills | Status: DC

## 2016-10-28 NOTE — Patient Instructions (Addendum)
1. Ear fullness with fluid behind the right TM - Start the prednisone pack provided today. - Consider using Afrin (oxymetazoline) one spray per nostril twice daily to help open things up and help the Eustachian tube drain.  - Only use the Afrin for 3-5 days at the most.  2. Return in about 3 months (around 01/28/2017).   Please inform us of any Emergency Department visits, hospitalizations, or changes in symptoms. Call us before going to the ED for breathing or allergy symptoms since we might be able to fit you in for a sick visit. Feel free to contact us anytime with any questions, problems, or concerns.  It was a pleasure to meet you today! Happy summer!   Websites that have reliable patient information: 1. American Academy of Asthma, Allergy, and Immunology: www.aaaai.org 2. Food Allergy Research and Education (FARE): foodallergy.org 3. Mothers of Asthmatics: http://www.asthmacommunitynetwork.org 4. American College of Allergy, Asthma, and Immunology: www.acaai.org

## 2016-10-28 NOTE — Progress Notes (Signed)
FOLLOW UP  Date of Service/Encounter:  10/28/16   Assessment:   Right ear fullness - with right OME   Severe persistent asthma, uncomplicated  Dysfunction of right eustachian tube  Seasonal allergic rhinitis   Asthma Reportables:  Severity: severe persistent  Risk: high Control: well controlled   Plan/Recommendations:   1. Ear fullness with fluid behind the right TM - Start the prednisone pack provided today. - Consider using Afrin (oxymetazoline) one spray per nostril twice daily to help open things up and help the Eustachian tube drain.  - Only use the Afrin for 3-5 days at the most.  2. Return in about 3 months (around 01/28/2017).  Subjective:   Dorothy Baker is a 51 y.o. female presenting today for follow up of  Chief Complaint  Patient presents with  . Follow-up    C/O not able to hear from right ear x 3 weeks.     Dorothy Baker has a history of the following: Patient Active Problem List   Diagnosis Date Noted  . Severe persistent asthma 12/24/2014  . Allergic rhinitis 12/24/2014  . Nasal polyposis 12/24/2014  . Allergic rhinoconjunctivitis 12/24/2014  . 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 Rhinitis 12/24/2014    History obtained from: chart review and patient.  Dorothy Baker was referred by Mancel Bale, PA-C.     Dorothy Baker is a 51yo female with a history of severe persistent asthma, ARC, and nasal polyposis with aspirin sensitivity and food allergy presenting for a follow up appointment. She was last seen in February 2018 by Dr. Nelva Bush. At that time, she was doing well on Xolair every two weeks. At the last visit, she was complaining about right ear fullness. She has already increased her Nasonex two sprays twice daily. She was started on a prednisone burst to help with her ear fullness. She was continued on Symbicort 160/4.5 two puffs twice daily as well as Xolair every weeks.   Since the last visit, she endorses right ear  fullness. This has been ongoing for three weeks. She has used her nasal sprays with minimal relief. She is using her nasal spray twice daily. She has had no fevers at all and there is no constant pain with this. The prednisone at the last visit might have worked, but she say Dr. Redmond Baseman in the meantime. She was told by Dr. Redmond Baseman to use nasal spray twice daily. She increased the nasal spray and it popped open once she got on a plane. She estimates that this happens around twice annually.   She has asthma and has been on Xolair for 6-7 years. This has helped significantly with her asthma and she is quite happy with how well she is doing. She remains on Symbicort 160/4.25 two puffs twice daily. She receives Xolair every two weeks and tolerates these well.   She has a history of nasal polyps. Her last surgery was in 2013 for turbinate reduction. She has had three surgeries in total and is very happy with how well she is doing with Dr. Redmond Baseman. She is able to smell and does not get recurrent sinus infections.   Otherwise, there have been no changes to her past medical history, surgical history, family history, or social history.    Review of Systems: a 14-point review of systems is pertinent for what is mentioned in HPI.  Otherwise, all other systems were negative. Constitutional: negative other than that listed in the HPI Eyes: negative other than that listed  in the HPI Ears, nose, mouth, throat, and face: negative other than that listed in the HPI Respiratory: negative other than that listed in the HPI Cardiovascular: negative other than that listed in the HPI Gastrointestinal: negative other than that listed in the HPI Genitourinary: negative other than that listed in the HPI Integument: negative other than that listed in the HPI Hematologic: negative other than that listed in the HPI Musculoskeletal: negative other than that listed in the HPI Neurological: negative other than that listed in the  HPI Allergy/Immunologic: negative other than that listed in the HPI    Objective:   Blood pressure 112/76, pulse 75, temperature 98.7 F (37.1 C), temperature source Oral, SpO2 98 %. There is no height or weight on file to calculate BMI.   Physical Exam:  General: Alert, interactive, in no acute distress. Pleasant female. Cooperative with the exam.  Eyes: No conjunctival injection present on the right, No conjunctival injection present on the left, PERRL bilaterally, No discharge on the right, No discharge on the left and No Horner-Trantas dots present Ears: Left TM pearly gray with normal light reflex, Right OME, Right TM intact without perforation and Left TM intact without perforation.  Nose/Throat: External nose within normal limits and septum midline, turbinates edematous and pale with clear discharge, post-pharynx erythematous with cobblestoning in the posterior oropharynx. Tonsils 2+ without exudates Neck: Supple without thyromegaly. Lungs: Clear to auscultation without wheezing, rhonchi or rales. No increased work of breathing. CV: Normal S1/S2, no murmurs. Capillary refill <2 seconds.  Skin: Warm and dry, without lesions or rashes. Neuro:   Grossly intact. No focal deficits appreciated. Responsive to questions.   Diagnostic studies: none    Salvatore Marvel, MD [facility]

## 2016-11-14 ENCOUNTER — Ambulatory Visit (INDEPENDENT_AMBULATORY_CARE_PROVIDER_SITE_OTHER): Payer: Managed Care, Other (non HMO) | Admitting: *Deleted

## 2016-11-14 DIAGNOSIS — J4540 Moderate persistent asthma, uncomplicated: Secondary | ICD-10-CM

## 2016-11-28 ENCOUNTER — Ambulatory Visit (INDEPENDENT_AMBULATORY_CARE_PROVIDER_SITE_OTHER): Payer: Managed Care, Other (non HMO) | Admitting: *Deleted

## 2016-11-28 DIAGNOSIS — J4540 Moderate persistent asthma, uncomplicated: Secondary | ICD-10-CM

## 2016-12-12 ENCOUNTER — Ambulatory Visit (INDEPENDENT_AMBULATORY_CARE_PROVIDER_SITE_OTHER): Payer: Managed Care, Other (non HMO) | Admitting: *Deleted

## 2016-12-12 DIAGNOSIS — J4540 Moderate persistent asthma, uncomplicated: Secondary | ICD-10-CM

## 2016-12-24 ENCOUNTER — Other Ambulatory Visit: Payer: Self-pay | Admitting: *Deleted

## 2016-12-24 MED ORDER — LEVOCETIRIZINE DIHYDROCHLORIDE 5 MG PO TABS: 5.0000 mg | ORAL_TABLET | Freq: Every evening | ORAL | 3 refills | Status: DC

## 2016-12-26 ENCOUNTER — Ambulatory Visit (INDEPENDENT_AMBULATORY_CARE_PROVIDER_SITE_OTHER): Payer: Managed Care, Other (non HMO) | Admitting: *Deleted

## 2016-12-26 DIAGNOSIS — J4540 Moderate persistent asthma, uncomplicated: Secondary | ICD-10-CM

## 2017-01-09 ENCOUNTER — Ambulatory Visit: Payer: Managed Care, Other (non HMO)

## 2017-01-10 ENCOUNTER — Ambulatory Visit (INDEPENDENT_AMBULATORY_CARE_PROVIDER_SITE_OTHER): Payer: Managed Care, Other (non HMO)

## 2017-01-10 DIAGNOSIS — J4540 Moderate persistent asthma, uncomplicated: Secondary | ICD-10-CM

## 2017-01-23 ENCOUNTER — Ambulatory Visit (INDEPENDENT_AMBULATORY_CARE_PROVIDER_SITE_OTHER): Payer: Managed Care, Other (non HMO)

## 2017-01-23 DIAGNOSIS — J4540 Moderate persistent asthma, uncomplicated: Secondary | ICD-10-CM | POA: Diagnosis not present

## 2017-02-06 ENCOUNTER — Ambulatory Visit (INDEPENDENT_AMBULATORY_CARE_PROVIDER_SITE_OTHER): Payer: Managed Care, Other (non HMO) | Admitting: *Deleted

## 2017-02-06 DIAGNOSIS — J4540 Moderate persistent asthma, uncomplicated: Secondary | ICD-10-CM

## 2017-02-20 ENCOUNTER — Ambulatory Visit: Payer: Managed Care, Other (non HMO)

## 2017-02-21 ENCOUNTER — Ambulatory Visit (INDEPENDENT_AMBULATORY_CARE_PROVIDER_SITE_OTHER): Payer: Managed Care, Other (non HMO)

## 2017-02-21 DIAGNOSIS — J4540 Moderate persistent asthma, uncomplicated: Secondary | ICD-10-CM | POA: Diagnosis not present

## 2017-03-06 ENCOUNTER — Ambulatory Visit: Payer: Self-pay

## 2017-03-07 ENCOUNTER — Ambulatory Visit (INDEPENDENT_AMBULATORY_CARE_PROVIDER_SITE_OTHER): Payer: Managed Care, Other (non HMO)

## 2017-03-07 DIAGNOSIS — J4540 Moderate persistent asthma, uncomplicated: Secondary | ICD-10-CM | POA: Diagnosis not present

## 2017-03-24 ENCOUNTER — Ambulatory Visit: Payer: Self-pay

## 2017-03-26 ENCOUNTER — Ambulatory Visit (INDEPENDENT_AMBULATORY_CARE_PROVIDER_SITE_OTHER): Payer: Managed Care, Other (non HMO)

## 2017-03-26 ENCOUNTER — Ambulatory Visit: Payer: Self-pay

## 2017-03-26 DIAGNOSIS — J4540 Moderate persistent asthma, uncomplicated: Secondary | ICD-10-CM

## 2017-04-08 ENCOUNTER — Ambulatory Visit: Payer: Self-pay

## 2017-04-10 ENCOUNTER — Ambulatory Visit (INDEPENDENT_AMBULATORY_CARE_PROVIDER_SITE_OTHER): Payer: Managed Care, Other (non HMO)

## 2017-04-10 DIAGNOSIS — J4540 Moderate persistent asthma, uncomplicated: Secondary | ICD-10-CM | POA: Diagnosis not present

## 2017-04-24 ENCOUNTER — Ambulatory Visit (INDEPENDENT_AMBULATORY_CARE_PROVIDER_SITE_OTHER): Payer: Managed Care, Other (non HMO)

## 2017-04-24 DIAGNOSIS — J4540 Moderate persistent asthma, uncomplicated: Secondary | ICD-10-CM

## 2017-05-08 ENCOUNTER — Ambulatory Visit (INDEPENDENT_AMBULATORY_CARE_PROVIDER_SITE_OTHER): Payer: Managed Care, Other (non HMO)

## 2017-05-08 DIAGNOSIS — J4540 Moderate persistent asthma, uncomplicated: Secondary | ICD-10-CM | POA: Diagnosis not present

## 2017-05-22 ENCOUNTER — Ambulatory Visit (INDEPENDENT_AMBULATORY_CARE_PROVIDER_SITE_OTHER): Payer: Managed Care, Other (non HMO) | Admitting: *Deleted

## 2017-05-22 DIAGNOSIS — J4540 Moderate persistent asthma, uncomplicated: Secondary | ICD-10-CM | POA: Diagnosis not present

## 2017-06-05 ENCOUNTER — Ambulatory Visit (INDEPENDENT_AMBULATORY_CARE_PROVIDER_SITE_OTHER): Payer: Managed Care, Other (non HMO) | Admitting: *Deleted

## 2017-06-05 DIAGNOSIS — J4540 Moderate persistent asthma, uncomplicated: Secondary | ICD-10-CM | POA: Diagnosis not present

## 2017-06-19 ENCOUNTER — Ambulatory Visit: Payer: Self-pay

## 2017-06-20 ENCOUNTER — Ambulatory Visit: Payer: Managed Care, Other (non HMO) | Admitting: Allergy & Immunology

## 2017-06-20 ENCOUNTER — Encounter: Payer: Self-pay | Admitting: Allergy & Immunology

## 2017-06-20 ENCOUNTER — Ambulatory Visit (INDEPENDENT_AMBULATORY_CARE_PROVIDER_SITE_OTHER): Payer: Managed Care, Other (non HMO)

## 2017-06-20 VITALS — BP 120/90 | HR 84 | Temp 98.4°F | Resp 16

## 2017-06-20 DIAGNOSIS — J309 Allergic rhinitis, unspecified: Secondary | ICD-10-CM

## 2017-06-20 DIAGNOSIS — J3089 Other allergic rhinitis: Secondary | ICD-10-CM

## 2017-06-20 DIAGNOSIS — J302 Other seasonal allergic rhinitis: Secondary | ICD-10-CM

## 2017-06-20 DIAGNOSIS — H1010 Acute atopic conjunctivitis, unspecified eye: Secondary | ICD-10-CM

## 2017-06-20 DIAGNOSIS — J4540 Moderate persistent asthma, uncomplicated: Secondary | ICD-10-CM

## 2017-06-20 DIAGNOSIS — H6981 Other specified disorders of Eustachian tube, right ear: Secondary | ICD-10-CM

## 2017-06-20 DIAGNOSIS — J4550 Severe persistent asthma, uncomplicated: Secondary | ICD-10-CM

## 2017-06-20 MED ORDER — BUDESONIDE-FORMOTEROL FUMARATE 160-4.5 MCG/ACT IN AERO: 2.0000 | INHALATION_SPRAY | Freq: Two times a day (BID) | RESPIRATORY_TRACT | 5 refills | Status: DC

## 2017-06-20 MED ORDER — LEVOCETIRIZINE DIHYDROCHLORIDE 5 MG PO TABS: 5.0000 mg | ORAL_TABLET | Freq: Every evening | ORAL | 3 refills | Status: DC

## 2017-06-20 MED ORDER — ALBUTEROL SULFATE HFA 108 (90 BASE) MCG/ACT IN AERS: 2.0000 | INHALATION_SPRAY | Freq: Four times a day (QID) | RESPIRATORY_TRACT | 2 refills | Status: DC | PRN

## 2017-06-20 MED ORDER — MOMETASONE FUROATE 50 MCG/ACT NA SUSP: 1.0000 | Freq: Two times a day (BID) | NASAL | 5 refills | Status: DC

## 2017-06-20 MED ORDER — MONTELUKAST SODIUM 10 MG PO TABS: 10.0000 mg | ORAL_TABLET | Freq: Every day | ORAL | 3 refills | Status: DC

## 2017-06-20 MED ORDER — EPINEPHRINE 0.3 MG/0.3ML IJ SOAJ: 0.3000 mg | Freq: Once | INTRAMUSCULAR | 1 refills | Status: AC

## 2017-06-20 NOTE — Patient Instructions (Addendum)
1. Severe persistent asthma, uncomplicated - Lung function looked great today. - We will keep you on the Symbicort and the Xolair.  - Daily controller medication(s): Xolair every two weeks, Singulair 10mg  daily and Symbicort 160/4.38mcg two puffs twice daily with spacer - Prior to physical activity: ProAir 2 puffs 10-15 minutes before physical activity. - Rescue medications: ProAir 4 puffs every 4-6 hours as needed - Asthma control goals:  * Full participation in all desired activities (may need albuterol before activity) * Albuterol use two time or less a week on average (not counting use with activity) * Cough interfering with sleep two time or less a month * Oral steroids no more than once a year * No hospitalizations  2. Seasonal and perennial allergic rhinitis - Continue with Nasonex 1-2 sprays daily. - Continue with Singulair 10mg  daily. - Continue with Xyzal 5mg  daily.  3. Return in about 6 months (around 12/21/2017).   Please inform us of any Emergency Department visits, hospitalizations, or changes in symptoms. Call us before going to the ED for breathing or allergy symptoms since we might be able to fit you in for a sick visit. Feel free to contact us anytime with any questions, problems, or concerns.  It was a pleasure to see you again today!  Websites that have reliable patient information: 1. American Academy of Asthma, Allergy, and Immunology: www.aaaai.org 2. Food Allergy Research and Education (FARE): foodallergy.org 3. Mothers of Asthmatics: http://www.asthmacommunitynetwork.org 4. American College of Allergy, Asthma, and Immunology: www.acaai.org

## 2017-06-20 NOTE — Progress Notes (Signed)
FOLLOW UP  Date of Service/Encounter:  06/20/17   Assessment:   Severe persistent asthma, uncomplicated  Seasonal and perennial allergic rhinitis   Asthma Reportables:  Severity: severe persistent  Risk: low Control: well controlled   Plan/Recommendations:   1. Severe persistent asthma, uncomplicated - Lung function looked great today. - Her overall asthma control is likely so good because she is able to avoid viral exposure from being in a traditional workplace.  - We will keep you on the Symbicort and the Xolair.  - Daily controller medication(s): Xolair every two weeks, Singulair 10mg  daily and Symbicort 160/4.26mcg two puffs twice daily with spacer - Prior to physical activity: ProAir 2 puffs 10-15 minutes before physical activity. - Rescue medications: ProAir 4 puffs every 4-6 hours as needed - Asthma control goals:  * Full participation in all desired activities (may need albuterol before activity) * Albuterol use two time or less a week on average (not counting use with activity) * Cough interfering with sleep two time or less a month * Oral steroids no more than once a year * No hospitalizations  2. Seasonal and perennial allergic rhinitis - Continue with Nasonex 1-2 sprays daily. - Continue with Singulair 10mg  daily. - Continue with Xyzal 5mg  daily.  3. Return in about 6 months (around 12/21/2017).  Subjective:   Dorothy Baker is a 52 y.o. female presenting today for follow up of  Chief Complaint  Patient presents with  . Asthma    Dorothy Baker has a history of the following: Patient Active Problem List   Diagnosis Date Noted  . Severe persistent asthma 12/24/2014  . Allergic rhinitis 12/24/2014  . Nasal polyposis 12/24/2014  . Allergic rhinoconjunctivitis 12/24/2014  . 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 Rhinitis 12/24/2014    History obtained from: chart review and patient.  [facility] Primary Care Provider is G[facility] Journey,  Damaris Hippo, PA-C.     Dorothy Baker is a 52 y.o. female presenting for a follow up visit. She was last seen in July 2018. At that time, she was endorsing right ear fullness and I noted a right OME. I provided a prednisone pack to help control inflammation and I recommended the judicious use of Afrin to help with drainage. She also has a history of severe persistent asthma as well as allergic rhinitis. She is on Xolair.   Since the last visit, she has done well. Dorothy Baker's asthma has been well controlled. She has not required rescue medication, experienced nocturnal awakenings due to lower respiratory symptoms, nor have activities of daily living been limited. She has required no Emergency Department or Urgent Care visits for her asthma. She has required zero courses of systemic steroids for asthma exacerbations since the last visit. ACT score today is 23, indicating excellent asthma symptom control.   She has asthma and has been on Xolair for 7-8 years. This has helped significantly with her asthma and she is quite happy with how well she is doing. She remains on Symbicort 160/4.25 two puffs twice daily. She receives Xolair every two weeks and tolerates these well. She is very happy with how well she is doing overall and is not interested in making any medication changes.   She remains on the Singulair, Xyzal, and Nasonex. This is working well for her and she does need refills today. She denies any sinus infections or antibiotic use since the last visit. She has a history of nasal polyps. Her last surgery was in 2013 for turbinate  reduction. She has had three surgeries in total and is very happy with how well she is doing with Dr. Redmond Baseman. She is able to smell and does not get recurrent sinus infections. She is only seeing Dr. Redmond Baseman as needed. She saw him last summer and had her hearing checked at that time.   Otherwise, there have been no changes to her past medical history, surgical history, family history, or social  history. 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 She is fortunate enough to work from home most days.     Review of Systems: a 14-point review of systems is pertinent for what is mentioned in HPI.  Otherwise, all other systems were negative. Constitutional: negative other than that listed in the HPI Eyes: negative other than that listed in the HPI Ears, nose, mouth, throat, and face: negative other than that listed in the HPI Respiratory: negative other than that listed in the HPI Cardiovascular: negative other than that listed in the HPI Gastrointestinal: negative other than that listed in the HPI Genitourinary: negative other than that listed in the HPI Integument: negative other than that listed in the HPI Hematologic: negative other than that listed in the HPI Musculoskeletal: negative other than that listed in the HPI Neurological: negative other than that listed in the HPI Allergy/Immunologic: negative other than that listed in the HPI    Objective:   Blood pressure 120/90, pulse 84, temperature 98.4 F (36.9 C), temperature source Oral, resp. rate 16, SpO2 94 %. There is no height or weight on file to calculate BMI.   Physical Exam:  General: Alert, interactive, in no acute distress. Obese female. Very pleasant and talkative.  Eyes: No conjunctival injection bilaterally, no discharge on the right, no discharge on the left and no Horner-Trantas dots present. PERRL bilaterally. EOMI without pain. No photophobia.  Ears: Right TM pearly gray with normal light reflex, Left TM pearly gray with normal light reflex, Right TM intact without perforation and Left TM intact without perforation.  Nose/Throat: External nose within normal limits and septum midline. Turbinates edematous and pale without discharge. Posterior oropharynx mildly erythematous without cobblestoning in the posterior oropharynx. Tonsils 2+ without exudates.  Tongue without thrush. Lungs: Clear  to auscultation without wheezing, rhonchi or rales. No increased work of breathing. CV: Normal S1/S2. No murmurs. Capillary refill <2 seconds.  Skin: Warm and dry, without lesions or rashes. Neuro:   Grossly intact. No focal deficits appreciated. Responsive to questions.  Diagnostic studies:   Spirometry: results normal (FEV1: 1.80/73%, FVC: 2.48/81%, FEV1/FVC: 73%).    Spirometry consistent with normal pattern.   Allergy Studies: none       Salvatore Marvel, MD [facility]

## 2017-07-03 ENCOUNTER — Ambulatory Visit (INDEPENDENT_AMBULATORY_CARE_PROVIDER_SITE_OTHER): Payer: Managed Care, Other (non HMO) | Admitting: *Deleted

## 2017-07-03 DIAGNOSIS — J4540 Moderate persistent asthma, uncomplicated: Secondary | ICD-10-CM

## 2017-07-17 ENCOUNTER — Ambulatory Visit (INDEPENDENT_AMBULATORY_CARE_PROVIDER_SITE_OTHER): Payer: Managed Care, Other (non HMO)

## 2017-07-17 DIAGNOSIS — J4540 Moderate persistent asthma, uncomplicated: Secondary | ICD-10-CM | POA: Diagnosis not present

## 2017-07-31 ENCOUNTER — Ambulatory Visit (INDEPENDENT_AMBULATORY_CARE_PROVIDER_SITE_OTHER): Payer: Managed Care, Other (non HMO)

## 2017-07-31 DIAGNOSIS — J4540 Moderate persistent asthma, uncomplicated: Secondary | ICD-10-CM

## 2017-08-21 ENCOUNTER — Ambulatory Visit (INDEPENDENT_AMBULATORY_CARE_PROVIDER_SITE_OTHER): Payer: Managed Care, Other (non HMO) | Admitting: *Deleted

## 2017-08-21 DIAGNOSIS — J4540 Moderate persistent asthma, uncomplicated: Secondary | ICD-10-CM

## 2017-09-04 ENCOUNTER — Ambulatory Visit (INDEPENDENT_AMBULATORY_CARE_PROVIDER_SITE_OTHER): Payer: Managed Care, Other (non HMO)

## 2017-09-04 DIAGNOSIS — J4540 Moderate persistent asthma, uncomplicated: Secondary | ICD-10-CM | POA: Diagnosis not present

## 2017-09-25 ENCOUNTER — Ambulatory Visit: Payer: Self-pay

## 2017-09-29 ENCOUNTER — Ambulatory Visit (INDEPENDENT_AMBULATORY_CARE_PROVIDER_SITE_OTHER): Payer: Managed Care, Other (non HMO)

## 2017-09-29 DIAGNOSIS — J4540 Moderate persistent asthma, uncomplicated: Secondary | ICD-10-CM | POA: Diagnosis not present

## 2017-10-13 ENCOUNTER — Ambulatory Visit: Payer: Self-pay

## 2017-10-16 ENCOUNTER — Ambulatory Visit (INDEPENDENT_AMBULATORY_CARE_PROVIDER_SITE_OTHER): Payer: Managed Care, Other (non HMO)

## 2017-10-16 DIAGNOSIS — J4540 Moderate persistent asthma, uncomplicated: Secondary | ICD-10-CM | POA: Diagnosis not present

## 2017-10-30 ENCOUNTER — Ambulatory Visit (INDEPENDENT_AMBULATORY_CARE_PROVIDER_SITE_OTHER): Payer: Managed Care, Other (non HMO) | Admitting: *Deleted

## 2017-10-30 DIAGNOSIS — J4540 Moderate persistent asthma, uncomplicated: Secondary | ICD-10-CM | POA: Diagnosis not present

## 2017-11-03 ENCOUNTER — Ambulatory Visit (INDEPENDENT_AMBULATORY_CARE_PROVIDER_SITE_OTHER): Payer: Managed Care, Other (non HMO) | Admitting: Family Medicine

## 2017-11-03 ENCOUNTER — Encounter: Payer: Self-pay | Admitting: Family Medicine

## 2017-11-03 VITALS — BP 110/80 | HR 82 | Temp 98.2°F | Resp 18

## 2017-11-03 DIAGNOSIS — J302 Other seasonal allergic rhinitis: Secondary | ICD-10-CM | POA: Diagnosis not present

## 2017-11-03 DIAGNOSIS — J4541 Moderate persistent asthma with (acute) exacerbation: Secondary | ICD-10-CM

## 2017-11-03 DIAGNOSIS — J3089 Other allergic rhinitis: Secondary | ICD-10-CM

## 2017-11-03 NOTE — Progress Notes (Signed)
[place] [address] Dept: [phone]  FOLLOW UP NOTE  Patient ID: Dorothy Baker. [place], female    DOB: December 21, 1965  Age: 52 y.o. MRN: [id number] Date of Office Visit: 11/03/2017  Assessment  Chief Complaint: Asthma  HPI Dorothy Baker. [name] is a 52 year old female who presents to the clinic for a follow up visit. She was last seen in this office on 06/20/2017 by Dr. Ernst Bowler for evaluation of asthma and allergic hinitis. At that time, her asthma was reported as well controlled with Xolair every 2 weeks, Symbicort 160- 2 puffs twice a day, montelukast 10 mg once a day, and albuterol as needed. Allergic rhinitis was well controlled with Nasonex and Xyzal.   At today's visit, she reports a dry cough and shortness of breath with wheezing that began in the first week of June while she was out of town. She began to feel better after a couple of weeks which was followed by a period of worsening with symptoms including deep dry cough, thick post nasal drip, shortness of breath and wheezing which is worse with activity and at nighttime. She denies fever, sweats or chills. She did see her primary care provider about 2 weeks ago and did not add any medications at that time. She continues using Symbicort 160- 2 puffs twice a day with a spacer, montelukast 10 mg once a day, and has been using albuterol inhaler 4 times a day with mild short term relief. She is currently taking Xyzal once a day and using Nasonex once a day. She denies nasal drainage, headache, and facial pain.  Her current medications are listed in the chart.   Drug Allergies:  Allergies  Allergen Reactions  . Aspirin Shortness Of Breath, Cough and Other (See Comments)    Asthma flair up Asthma attack Asthma flair up  . Other Diarrhea, Nausea And Vomiting and Other (See Comments)    Peanut intolerance Peanut intolerance Peanut  . Ciprofloxacin Other (See Comments)  . Quinolones Other (See Comments)    Questionable -  muscle pain     Physical Exam: BP 110/80   Pulse 82   Temp 98.2 F (36.8 C) (Oral)   Resp 18   SpO2 95%    Physical Exam  Constitutional: She appears well-developed and well-nourished.  HENT:  Head: Normocephalic.  Right Ear: External ear normal.  Left Ear: External ear normal.  Mouth/Throat: Oropharynx is clear and moist.  Bilateral nares slightly erythematous and edematous with clear nasal drainage noted. Septal deviation noted. Pharynx normal. Ears normal. Eyes normal.   Eyes: Conjunctivae are normal.  Neck: Normal range of motion. Neck supple.  Cardiovascular: Normal rate, regular rhythm and normal heart sounds.  No murmur noted  Pulmonary/Chest: Effort normal and breath sounds normal.  Lungs clear to auscultation  Musculoskeletal: Normal range of motion.  Neurological: She is alert.  Skin: Skin is warm and dry.  Psychiatric: She has a normal mood and affect. Her behavior is normal. Judgment and thought content normal.  Vitals reviewed.   Diagnostics: FVC 2.57, FEV1 1.86. Predicted FVC 3.08, predicted FEV1 2.45. Spirometry is within the normal range.  Assessment and Plan: 1. Seasonal and perennial allergic rhinitis   2. Moderate persistent asthma with acute exacerbation     Patient Instructions  Continue Xolair injections once every 2 weeks Continue Symbicort 160- 2 puffs twice a day with a spacer to prevent cough and wheeze Continue montelukast 10 mg once a day to prevent cough and wheeze  Ventolin 2 puffs every 4 hours as needed for cough or wheeze Nasonex 1-2 sprays once a day as needed for stuffy nose Xyzal 5 mg once a day as needed for a runny nose Prednisone 10 mg tablets. Take 2 tablets twice a day for 3 days, then take 2 tablets once a day for 1 day, then take 1 tablet on the 5th day, then stop.  Continue the other medications as listed in your chart  Call me if this plan is not working well for you  Follow up in 3 months or sooner if needed   Return  in about 3 months (around 02/03/2018), or if symptoms worsen or fail to improve.   Thank you for the opportunity to care for this patient.  Please do not hesitate to contact me with questions.  Gareth Morgan, FNP [facility]  I have provided oversight concerning Gareth Morgan' evaluation and treatment of this patient's health issues addressed during today's encounter. I agree with the assessment and therapeutic plan as outlined in the note.   Thank you for the opportunity to care for this patient.  Please do not hesitate to contact me with questions.  Penne Lash, M.D.  [facility] [address] [place], [place] [address] [phone]

## 2017-11-03 NOTE — Patient Instructions (Addendum)
Continue Xolair injections once every 2 weeks Continue Symbicort 160- 2 puffs twice a day with a spacer to prevent cough and wheeze Continue montelukast 10 mg once a day to prevent cough and wheeze Ventolin 2 puffs every 4 hours as needed for cough or wheeze Nasonex 1-2 sprays once a day as needed for stuffy nose Xyzal 5 mg once a day as needed for a runny nose Prednisone 10 mg tablets. Take 2 tablets twice a day for 3 days, then take 2 tablets once a day for 1 day, then take 1 tablet on the 5th day, then stop.  Continue the other medications as listed in your chart  Call me if this plan is not working well for you  Follow up in 3 months or sooner if needed

## 2017-11-05 ENCOUNTER — Other Ambulatory Visit: Payer: Self-pay | Admitting: *Deleted

## 2017-11-05 DIAGNOSIS — Z1231 Encounter for screening mammogram for malignant neoplasm of breast: Secondary | ICD-10-CM

## 2017-11-13 ENCOUNTER — Ambulatory Visit (INDEPENDENT_AMBULATORY_CARE_PROVIDER_SITE_OTHER): Payer: Managed Care, Other (non HMO) | Admitting: *Deleted

## 2017-11-13 DIAGNOSIS — J4540 Moderate persistent asthma, uncomplicated: Secondary | ICD-10-CM

## 2017-11-25 ENCOUNTER — Telehealth: Payer: Self-pay

## 2017-11-26 NOTE — Telephone Encounter (Signed)
Opened by mistake when pt called to chg appt.

## 2017-11-27 ENCOUNTER — Ambulatory Visit: Payer: Self-pay

## 2017-11-28 ENCOUNTER — Ambulatory Visit
Admission: RE | Admit: 2017-11-28 | Discharge: 2017-11-28 | Disposition: A | Discharge status: Home / Self Care | Payer: Managed Care, Other (non HMO) | Source: Ambulatory Visit | Attending: *Deleted | Admitting: *Deleted

## 2017-11-28 DIAGNOSIS — Z1231 Encounter for screening mammogram for malignant neoplasm of breast: Secondary | ICD-10-CM

## 2017-12-04 ENCOUNTER — Ambulatory Visit (INDEPENDENT_AMBULATORY_CARE_PROVIDER_SITE_OTHER): Payer: Managed Care, Other (non HMO)

## 2017-12-04 DIAGNOSIS — J4540 Moderate persistent asthma, uncomplicated: Secondary | ICD-10-CM

## 2017-12-17 ENCOUNTER — Ambulatory Visit (INDEPENDENT_AMBULATORY_CARE_PROVIDER_SITE_OTHER): Payer: Managed Care, Other (non HMO) | Admitting: *Deleted

## 2017-12-17 DIAGNOSIS — J4540 Moderate persistent asthma, uncomplicated: Secondary | ICD-10-CM

## 2017-12-18 ENCOUNTER — Ambulatory Visit: Payer: Self-pay

## 2018-01-01 ENCOUNTER — Ambulatory Visit (INDEPENDENT_AMBULATORY_CARE_PROVIDER_SITE_OTHER): Payer: Managed Care, Other (non HMO) | Admitting: *Deleted

## 2018-01-01 DIAGNOSIS — J4540 Moderate persistent asthma, uncomplicated: Secondary | ICD-10-CM | POA: Diagnosis not present

## 2018-01-15 ENCOUNTER — Ambulatory Visit (INDEPENDENT_AMBULATORY_CARE_PROVIDER_SITE_OTHER): Payer: Managed Care, Other (non HMO)

## 2018-01-15 DIAGNOSIS — J4540 Moderate persistent asthma, uncomplicated: Secondary | ICD-10-CM | POA: Diagnosis not present

## 2018-01-29 ENCOUNTER — Ambulatory Visit (INDEPENDENT_AMBULATORY_CARE_PROVIDER_SITE_OTHER): Payer: Managed Care, Other (non HMO)

## 2018-01-29 DIAGNOSIS — J4540 Moderate persistent asthma, uncomplicated: Secondary | ICD-10-CM | POA: Diagnosis not present

## 2018-02-12 ENCOUNTER — Ambulatory Visit (INDEPENDENT_AMBULATORY_CARE_PROVIDER_SITE_OTHER): Payer: Managed Care, Other (non HMO)

## 2018-02-12 DIAGNOSIS — J4540 Moderate persistent asthma, uncomplicated: Secondary | ICD-10-CM | POA: Diagnosis not present

## 2018-02-26 ENCOUNTER — Ambulatory Visit (INDEPENDENT_AMBULATORY_CARE_PROVIDER_SITE_OTHER): Payer: Managed Care, Other (non HMO)

## 2018-02-26 DIAGNOSIS — J4540 Moderate persistent asthma, uncomplicated: Secondary | ICD-10-CM

## 2018-03-12 ENCOUNTER — Ambulatory Visit: Payer: Self-pay

## 2018-03-18 ENCOUNTER — Ambulatory Visit (INDEPENDENT_AMBULATORY_CARE_PROVIDER_SITE_OTHER): Payer: Managed Care, Other (non HMO)

## 2018-03-18 DIAGNOSIS — J4540 Moderate persistent asthma, uncomplicated: Secondary | ICD-10-CM | POA: Diagnosis not present

## 2018-04-01 ENCOUNTER — Ambulatory Visit (INDEPENDENT_AMBULATORY_CARE_PROVIDER_SITE_OTHER): Payer: Managed Care, Other (non HMO)

## 2018-04-01 DIAGNOSIS — J4540 Moderate persistent asthma, uncomplicated: Secondary | ICD-10-CM | POA: Diagnosis not present

## 2018-04-02 ENCOUNTER — Ambulatory Visit: Payer: Self-pay

## 2018-04-23 ENCOUNTER — Ambulatory Visit: Payer: Self-pay

## 2018-04-24 ENCOUNTER — Ambulatory Visit (INDEPENDENT_AMBULATORY_CARE_PROVIDER_SITE_OTHER): Payer: Managed Care, Other (non HMO) | Admitting: *Deleted

## 2018-04-24 DIAGNOSIS — J4540 Moderate persistent asthma, uncomplicated: Secondary | ICD-10-CM | POA: Diagnosis not present

## 2018-05-07 ENCOUNTER — Ambulatory Visit: Payer: Self-pay

## 2018-05-07 ENCOUNTER — Ambulatory Visit: Payer: Managed Care, Other (non HMO)

## 2018-05-07 DIAGNOSIS — J4540 Moderate persistent asthma, uncomplicated: Secondary | ICD-10-CM

## 2018-05-22 ENCOUNTER — Ambulatory Visit (INDEPENDENT_AMBULATORY_CARE_PROVIDER_SITE_OTHER): Payer: Managed Care, Other (non HMO)

## 2018-05-22 DIAGNOSIS — J4540 Moderate persistent asthma, uncomplicated: Secondary | ICD-10-CM | POA: Diagnosis not present

## 2018-06-04 ENCOUNTER — Ambulatory Visit (INDEPENDENT_AMBULATORY_CARE_PROVIDER_SITE_OTHER): Payer: Managed Care, Other (non HMO)

## 2018-06-04 DIAGNOSIS — J4540 Moderate persistent asthma, uncomplicated: Secondary | ICD-10-CM | POA: Diagnosis not present

## 2018-06-05 ENCOUNTER — Other Ambulatory Visit: Payer: Self-pay

## 2018-06-05 DIAGNOSIS — H6981 Other specified disorders of Eustachian tube, right ear: Secondary | ICD-10-CM

## 2018-06-05 DIAGNOSIS — H1010 Acute atopic conjunctivitis, unspecified eye: Secondary | ICD-10-CM

## 2018-06-05 DIAGNOSIS — J309 Allergic rhinitis, unspecified: Principal | ICD-10-CM

## 2018-06-10 ENCOUNTER — Other Ambulatory Visit: Payer: Self-pay | Admitting: Allergy

## 2018-06-10 DIAGNOSIS — H1010 Acute atopic conjunctivitis, unspecified eye: Secondary | ICD-10-CM

## 2018-06-10 DIAGNOSIS — H6981 Other specified disorders of Eustachian tube, right ear: Secondary | ICD-10-CM

## 2018-06-10 DIAGNOSIS — J309 Allergic rhinitis, unspecified: Principal | ICD-10-CM

## 2018-06-11 ENCOUNTER — Other Ambulatory Visit: Payer: Self-pay

## 2018-06-11 DIAGNOSIS — J309 Allergic rhinitis, unspecified: Principal | ICD-10-CM

## 2018-06-11 DIAGNOSIS — H1010 Acute atopic conjunctivitis, unspecified eye: Secondary | ICD-10-CM

## 2018-06-11 DIAGNOSIS — H6981 Other specified disorders of Eustachian tube, right ear: Secondary | ICD-10-CM

## 2018-06-11 MED ORDER — MOMETASONE FUROATE 50 MCG/ACT NA SUSP: 1.0000 | Freq: Two times a day (BID) | NASAL | 0 refills | Status: DC

## 2018-06-11 NOTE — Telephone Encounter (Signed)
Pt. Needing Nasonex  And pt. Didn't have anymore refills. We haven't seen the  pt. For almost a year so I gave the pt. A courtesy refill. Pt. Has a scheduled appointment on March 5th with Dr. Nelva Bush at 11:00 am

## 2018-06-18 ENCOUNTER — Ambulatory Visit (INDEPENDENT_AMBULATORY_CARE_PROVIDER_SITE_OTHER): Payer: Managed Care, Other (non HMO)

## 2018-06-18 DIAGNOSIS — J4540 Moderate persistent asthma, uncomplicated: Secondary | ICD-10-CM | POA: Diagnosis not present

## 2018-06-25 ENCOUNTER — Encounter: Payer: Self-pay | Admitting: Allergy

## 2018-06-25 ENCOUNTER — Ambulatory Visit (INDEPENDENT_AMBULATORY_CARE_PROVIDER_SITE_OTHER): Payer: Managed Care, Other (non HMO) | Admitting: Allergy

## 2018-06-25 ENCOUNTER — Ambulatory Visit: Payer: Self-pay | Admitting: Allergy

## 2018-06-25 VITALS — BP 98/62 | HR 88 | Temp 98.3°F | Resp 16 | Ht 67.0 in | Wt 218.8 lb

## 2018-06-25 DIAGNOSIS — J3089 Other allergic rhinitis: Secondary | ICD-10-CM | POA: Diagnosis not present

## 2018-06-25 DIAGNOSIS — J4550 Severe persistent asthma, uncomplicated: Secondary | ICD-10-CM

## 2018-06-25 DIAGNOSIS — J302 Other seasonal allergic rhinitis: Secondary | ICD-10-CM

## 2018-06-25 MED ORDER — MONTELUKAST SODIUM 10 MG PO TABS: 10.0000 mg | ORAL_TABLET | Freq: Every day | ORAL | 3 refills | Status: DC

## 2018-06-25 MED ORDER — ALBUTEROL SULFATE HFA 108 (90 BASE) MCG/ACT IN AERS: 2.0000 | INHALATION_SPRAY | Freq: Four times a day (QID) | RESPIRATORY_TRACT | 2 refills | Status: DC | PRN

## 2018-06-25 MED ORDER — LEVOCETIRIZINE DIHYDROCHLORIDE 5 MG PO TABS
5.0000 mg | ORAL_TABLET | Freq: Every evening | ORAL | 3 refills | Status: DC
Start: 2018-06-25 — End: 2019-07-05

## 2018-06-25 MED ORDER — BUDESONIDE-FORMOTEROL FUMARATE 160-4.5 MCG/ACT IN AERO: 2.0000 | INHALATION_SPRAY | Freq: Two times a day (BID) | RESPIRATORY_TRACT | 5 refills | Status: DC

## 2018-06-25 MED ORDER — MOMETASONE FUROATE 50 MCG/ACT NA SUSP: 1.0000 | Freq: Two times a day (BID) | NASAL | 5 refills | Status: DC

## 2018-06-25 NOTE — Progress Notes (Signed)
Follow-up Note  RE: Yania Bogie. Mcfarlane MRN: [id number] DOB: 30-Jan-1966 Date of Office Visit: 06/25/2018   History of present illness: Zoe Goonan. [place] is a 53 y.o. female presenting today for follow-up of asthma and allergic rhinitis.  She was last seen in the office on November 03, 2017 by our nurse practitioner Gareth Morgan.  At this visit she was having increased asthma symptoms concerning for exacerbation and she was treated with a prednisone course.  With her asthma she feels she is under really good control at this time.  She is on Xolair injections every 2 weeks. She also takes symbicort 167mcg 2 puffs twice a day and singulair daily.  She states she has not required her albuterol that she can remember (however states will use if she gets choked up from laughing or drinking liquids).  She denies nighttime awakenings.  She denies having any asthma flares and states that her asthma triggers are typically induced by very hot temps or cold temps.  She does feel like she could possibly wean some of her asthma regimen.  She states that with her Xolair she has been later than 2 weeks getting her injection and has not noted any worsening asthma symptoms.  However she states there was a time where she missed over a month due to travel and she states she was ready to get back to get her Xolair dose in.  Thus she feels that she might do well with monthly Xolair injections.    Upon review of EMR she did have a URI in January 2020 where she did go to an urgent care where she was prescribed a prednisone course as well as a Z-Pak and Best boy.  In September 2019 she was treated for an acute sinusitis with Augmentin and a 5-day prednisone course.  For her allergy symptom control she takes xyzal daily as well as performs saline rinse twice a day and follows with her nasonex 1 spray twice a day.  This does control her allergy symptoms.    Review of systems: Review of Systems  Constitutional: Negative for  chills, fever and malaise/fatigue.  HENT: Negative for congestion, ear discharge, nosebleeds and sore throat.   Eyes: Negative for pain, discharge and redness.  Respiratory: Negative for cough, shortness of breath and wheezing.   Cardiovascular: Negative for chest pain.  Gastrointestinal: Negative for abdominal pain, constipation, diarrhea, heartburn, nausea and vomiting.  Musculoskeletal: Negative for joint pain.  Skin: Negative for itching and rash.  Neurological: Negative for headaches.    All other systems negative unless noted above in HPI  Past medical/social/surgical/family history have been reviewed and are unchanged unless specifically indicated below.  No changes  Medication List: Allergies as of 06/25/2018      Reactions   Aspirin Shortness Of Breath, Cough, Other (See Comments)   Asthma flair up Asthma attack Asthma flair up   Other Diarrhea, Nausea And Vomiting, Other (See Comments)   Peanut intolerance Peanut intolerance Peanut   Ciprofloxacin Other (See Comments)   Quinolones Other (See Comments)   Questionable - muscle pain       Medication List       Accurate as of June 25, 2018  1:30 PM. Always use your most recent med list.        AEROCHAMBER PLUS inhaler Use as directed with MDI   albuterol 108 (90 Base) MCG/ACT inhaler Commonly known as:  VENTOLIN HFA Inhale 2 puffs into the lungs every 6 (six) hours as  needed for wheezing or shortness of breath.   amitriptyline 25 MG tablet Commonly known as:  ELAVIL Take 25 mg by mouth as needed.   budesonide-formoterol 160-4.5 MCG/ACT inhaler Commonly known as:  SYMBICORT Inhale 2 puffs into the lungs 2 (two) times daily.   CONTRAVE 8-90 MG Tb12 Generic drug:  Naltrexone-buPROPion HCl ER   levocetirizine 5 MG tablet Commonly known as:  XYZAL Take 1 tablet (5 mg total) by mouth every evening.   mometasone 50 MCG/ACT nasal spray Commonly known as:  NASONEX Place 1 spray into the nose 2 (two) times  daily.   montelukast 10 MG tablet Commonly known as:  SINGULAIR Take 1 tablet (10 mg total) by mouth at bedtime.   omeprazole 20 MG capsule Commonly known as:  PRILOSEC Take 20 mg by mouth daily.   onetouch ultrasoft lancets Check fasting glucose readings once daily.   valsartan-hydrochlorothiazide 160-25 MG tablet Commonly known as:  DIOVAN HCT Take 1 tablet by mouth daily.   valsartan-hydrochlorothiazide 160-12.5 MG tablet Commonly known as:  DIOVAN-HCT Take 160 tablets by mouth daily.   Vitamin D3 125 MCG (5000 UT) Caps Take 5,000 Units by mouth daily.   VYVANSE 40 MG capsule Generic drug:  lisdexamfetamine TK 1 C PO BID   XOLAIR 150 MG injection Generic drug:  omalizumab       Known medication allergies: Allergies  Allergen Reactions  . Aspirin Shortness Of Breath, Cough and Other (See Comments)    Asthma flair up Asthma attack Asthma flair up  . Other Diarrhea, Nausea And Vomiting and Other (See Comments)    Peanut intolerance Peanut intolerance Peanut  . Ciprofloxacin Other (See Comments)  . Quinolones Other (See Comments)    Questionable - muscle pain      Physical examination: Blood pressure 98/62, pulse 88, temperature 98.3 F (36.8 C), temperature source Oral, resp. rate 16, height 5\' 7"  (1.702 m), weight 218 lb 12.8 oz (99.2 kg), SpO2 99 %.  General: Alert, interactive, in no acute distress. HEENT: PERRLA, TMs pearly gray, turbinates minimally edematous without discharge, post-pharynx non erythematous. Neck: Supple without lymphadenopathy. Lungs: Clear to auscultation without wheezing, rhonchi or rales. {no increased work of breathing. CV: Normal S1, S2 without murmurs. Abdomen: Nondistended, nontender. Skin: Warm and dry, without lesions or rashes. Extremities:  No clubbing, cyanosis or edema. Neuro:   Grossly intact.  Diagnositics/Labs:  Spirometry: FEV1: 1.65L 55%, FVC: 2.35L 62% predicted.  Lung function appears reduced  Assessment  and plan:   Asthma, severe persistent - she feels she is under good control at this time.  She he has a steroid course in 04/2018 however appears tied to sinus upper respiratory symptoms.  Given that from a symptoms standpoint her asthma is under good control will see if she can tolarate monthly Xolair vs every 2 week injections.    - Daily controller medication(s): Xolair next dose will be 3/26 for a 4 week and if she does well will keep at evrey 4 week doses otherwise if develops more symptoms will resume every 2 weeks. Continue Singulair 10mg  daily and Symbicort 160/4.52mcg two puffs twice daily with spacer - Prior to physical activity: ProAir 2 puffs 10-15 minutes before physical activity. - Rescue medications: ProAir 4 puffs every 4-6 hours as needed - Asthma control goals:  * Full participation in all desired activities (may need albuterol before activity) * Albuterol use two time or less a week on average (not counting use with activity) * Cough interfering with sleep two  time or less a month * Oral steroids no more than once a year * No hospitalizations  Seasonal and perennial allergic rhinitis - Continue with Nasonex 1-2 sprays daily. - Continue with Singulair 10mg  daily. - Continue with Xyzal 5mg  daily.  I appreciate the opportunity to take part in Michaella's care. Please do not hesitate to contact me with questions.  Sincerely,   Prudy Feeler, MD Allergy/Immunology [facility]

## 2018-06-25 NOTE — Patient Instructions (Addendum)
Asthma - Lung function looked great today. - We will keep you on the Symbicort and the Xolair.  - Daily controller medication(s): Xolair every two weeks, Singulair 10mg  daily and Symbicort 160/4.16mcg two puffs twice daily with spacer - Prior to physical activity: ProAir 2 puffs 10-15 minutes before physical activity. - Rescue medications: ProAir 4 puffs every 4-6 hours as needed - Asthma control goals:  * Full participation in all desired activities (may need albuterol before activity) * Albuterol use two time or less a week on average (not counting use with activity) * Cough interfering with sleep two time or less a month * Oral steroids no more than once a year * No hospitalizations  Seasonal and perennial allergic rhinitis - Continue with Nasonex 1-2 sprays daily. - Continue with Singulair 10mg  daily. - Continue with Xyzal 5mg  daily.

## 2018-07-16 ENCOUNTER — Other Ambulatory Visit: Payer: Self-pay

## 2018-07-16 ENCOUNTER — Ambulatory Visit (INDEPENDENT_AMBULATORY_CARE_PROVIDER_SITE_OTHER): Payer: Managed Care, Other (non HMO) | Admitting: *Deleted

## 2018-07-16 DIAGNOSIS — J4540 Moderate persistent asthma, uncomplicated: Secondary | ICD-10-CM | POA: Diagnosis not present

## 2018-07-29 ENCOUNTER — Other Ambulatory Visit: Payer: Self-pay

## 2018-07-29 MED ORDER — BUDESONIDE-FORMOTEROL FUMARATE 160-4.5 MCG/ACT IN AERO: 2.0000 | INHALATION_SPRAY | Freq: Two times a day (BID) | RESPIRATORY_TRACT | 5 refills | Status: DC

## 2018-07-30 ENCOUNTER — Other Ambulatory Visit: Payer: Self-pay

## 2018-07-30 ENCOUNTER — Ambulatory Visit (INDEPENDENT_AMBULATORY_CARE_PROVIDER_SITE_OTHER): Payer: Managed Care, Other (non HMO)

## 2018-07-30 DIAGNOSIS — J4550 Severe persistent asthma, uncomplicated: Secondary | ICD-10-CM

## 2018-07-30 DIAGNOSIS — J309 Allergic rhinitis, unspecified: Secondary | ICD-10-CM

## 2018-08-07 ENCOUNTER — Other Ambulatory Visit: Payer: Self-pay | Admitting: *Deleted

## 2018-08-07 MED ORDER — XOLAIR 150 MG ~~LOC~~ SOLR: 375.0000 mg | SUBCUTANEOUS | 10 refills | Status: DC

## 2018-08-13 ENCOUNTER — Other Ambulatory Visit: Payer: Self-pay

## 2018-08-13 ENCOUNTER — Ambulatory Visit (INDEPENDENT_AMBULATORY_CARE_PROVIDER_SITE_OTHER): Payer: Managed Care, Other (non HMO)

## 2018-08-13 DIAGNOSIS — J4540 Moderate persistent asthma, uncomplicated: Secondary | ICD-10-CM

## 2018-08-27 ENCOUNTER — Other Ambulatory Visit: Payer: Self-pay

## 2018-08-27 ENCOUNTER — Ambulatory Visit: Payer: Self-pay

## 2018-08-27 ENCOUNTER — Ambulatory Visit (INDEPENDENT_AMBULATORY_CARE_PROVIDER_SITE_OTHER): Payer: Managed Care, Other (non HMO)

## 2018-08-27 DIAGNOSIS — J4540 Moderate persistent asthma, uncomplicated: Secondary | ICD-10-CM | POA: Diagnosis not present

## 2018-09-10 ENCOUNTER — Ambulatory Visit (INDEPENDENT_AMBULATORY_CARE_PROVIDER_SITE_OTHER): Payer: Managed Care, Other (non HMO)

## 2018-09-10 ENCOUNTER — Other Ambulatory Visit: Payer: Self-pay

## 2018-09-10 DIAGNOSIS — J4540 Moderate persistent asthma, uncomplicated: Secondary | ICD-10-CM

## 2018-09-24 ENCOUNTER — Ambulatory Visit (INDEPENDENT_AMBULATORY_CARE_PROVIDER_SITE_OTHER): Payer: Managed Care, Other (non HMO)

## 2018-09-24 ENCOUNTER — Other Ambulatory Visit: Payer: Self-pay

## 2018-09-24 DIAGNOSIS — J4540 Moderate persistent asthma, uncomplicated: Secondary | ICD-10-CM

## 2018-10-08 ENCOUNTER — Ambulatory Visit: Payer: Self-pay

## 2018-10-08 ENCOUNTER — Ambulatory Visit (INDEPENDENT_AMBULATORY_CARE_PROVIDER_SITE_OTHER): Payer: Managed Care, Other (non HMO)

## 2018-10-08 DIAGNOSIS — J4540 Moderate persistent asthma, uncomplicated: Secondary | ICD-10-CM

## 2018-10-09 ENCOUNTER — Ambulatory Visit: Payer: Self-pay

## 2018-10-22 ENCOUNTER — Other Ambulatory Visit: Payer: Self-pay

## 2018-10-22 ENCOUNTER — Ambulatory Visit (INDEPENDENT_AMBULATORY_CARE_PROVIDER_SITE_OTHER): Payer: Managed Care, Other (non HMO)

## 2018-10-22 DIAGNOSIS — J4540 Moderate persistent asthma, uncomplicated: Secondary | ICD-10-CM

## 2018-11-05 ENCOUNTER — Ambulatory Visit (INDEPENDENT_AMBULATORY_CARE_PROVIDER_SITE_OTHER): Payer: Managed Care, Other (non HMO)

## 2018-11-05 ENCOUNTER — Other Ambulatory Visit: Payer: Self-pay

## 2018-11-05 DIAGNOSIS — J4540 Moderate persistent asthma, uncomplicated: Secondary | ICD-10-CM

## 2018-11-19 ENCOUNTER — Other Ambulatory Visit: Payer: Self-pay

## 2018-11-19 ENCOUNTER — Encounter: Payer: Self-pay | Admitting: Family Medicine

## 2018-11-19 ENCOUNTER — Ambulatory Visit: Payer: Managed Care, Other (non HMO)

## 2018-11-19 ENCOUNTER — Ambulatory Visit (INDEPENDENT_AMBULATORY_CARE_PROVIDER_SITE_OTHER): Payer: Managed Care, Other (non HMO) | Admitting: Family Medicine

## 2018-11-19 VITALS — BP 122/80 | HR 80 | Temp 97.6°F | Resp 18

## 2018-11-19 DIAGNOSIS — H1010 Acute atopic conjunctivitis, unspecified eye: Secondary | ICD-10-CM | POA: Diagnosis not present

## 2018-11-19 DIAGNOSIS — J309 Allergic rhinitis, unspecified: Secondary | ICD-10-CM | POA: Diagnosis not present

## 2018-11-19 DIAGNOSIS — J4540 Moderate persistent asthma, uncomplicated: Secondary | ICD-10-CM

## 2018-11-19 MED ORDER — BUDESONIDE 0.5 MG/2ML IN SUSP
RESPIRATORY_TRACT | 5 refills | Status: DC
Start: 2018-11-19 — End: 2019-09-28

## 2018-11-19 MED ORDER — CARBINOXAMINE MALEATE 6 MG PO TABS: 1.0000 | ORAL_TABLET | Freq: Four times a day (QID) | ORAL | 5 refills | Status: DC | PRN

## 2018-11-19 NOTE — Progress Notes (Addendum)
[place] [id number] Dept: [phone]  FOLLOW UP NOTE  Patient ID: Dorothy Baker. Dorothy Baker, female    DOB: [birth year]  Age: 53 y.o. MRN: [id number] Date of Office Visit: 11/19/2018  Assessment  Chief Complaint: Sinus Problem (post nasal drainage)  HPI Dorothy Constancio. Baker is a 53 year old female who presents to the clinic for a follow up visit. She was last seen in this clinic on 06/25/2018 by Dr. Nelva Bush for evaluation of asthma and allergic rhinitis. At that time, she decreased Xolair injections from every 14 days to once every 28 days. At today's visit, she reports her allergic rhinitis has been poorly controlled with thick post nasal drainage, cough, and frequent throat clearing that has increased over the last 2 months. She is currently taking Xyzal once a day and using Nasonex with little relief of symptoms. Reflux is reported as well controlled with no heartburn or regurgitation. She continues omeprazole 20 mg once a day. On the advice of her PCP, she has recently increased her dose of omeprazole to 20 mg twice a day with no benefit and has since returned to once a day. Asthma is reported as moderately well controlled with symptoms of shortness of breath and wheeze exclusively while coughing related to throat irritation. She continues Symbicort 160-2 puffs twice a day, montelukast once a day, and albuterol daily due to cough. She continues Xolair 300 mg injection every 14 days. Her current medications are listed in the chart.   Drug Allergies:  Allergies  Allergen Reactions  . Aspirin Shortness Of Breath, Cough and Other (See Comments)    Asthma flair up Asthma attack Asthma flair up  . Other Diarrhea, Nausea And Vomiting and Other (See Comments)    Peanut intolerance Peanut intolerance Peanut  . Ciprofloxacin Other (See Comments)  . Quinolones Other (See Comments)    Questionable - muscle pain     Physical Exam: BP 122/80   Pulse 80   Temp 97.6 F (36.4 C)  (Temporal)   Resp 18   SpO2 98%    Physical Exam Vitals signs reviewed.  Constitutional:      Appearance: Normal appearance.  HENT:     Head: Normocephalic and atraumatic.     Right Ear: Tympanic membrane normal.     Left Ear: Tympanic membrane normal.     Nose:     Comments: Bilateral nares erythematous with clear nasal drainage noted. Pharynx erythematous with no exudate noted. Ears normal. Eyes normal. Eyes:     Conjunctiva/sclera: Conjunctivae normal.  Neck:     Musculoskeletal: Normal range of motion and neck supple.  Cardiovascular:     Rate and Rhythm: Normal rate and regular rhythm.     Heart sounds: Normal heart sounds. No murmur.  Pulmonary:     Effort: Pulmonary effort is normal.     Breath sounds: Normal breath sounds.     Comments: Lungs clear to auscultation Musculoskeletal: Normal range of motion.  Skin:    General: Skin is warm and dry.  Neurological:     Mental Status: She is alert and oriented to person, place, and time.  Psychiatric:        Mood and Affect: Mood normal.        Behavior: Behavior normal.        Thought Content: Thought content normal.        Judgment: Judgment normal.    Diagnostics: FVC 2.28, FEV1 1.61.  Predicted FVC 3.14, predicted FEV1 2.50.  Spirometry indicates mild restriction.  This is consistent with previous spirometry readings.  Assessment and Plan: 1. Moderate persistent asthma without complication   2. Allergic rhinoconjunctivitis     Meds ordered this encounter  Medications  . Carbinoxamine Maleate (RYVENT) 6 MG TABS    Sig: Take 1 tablet by mouth every 6 (six) hours as needed.    Dispense:  120 tablet    Refill:  5  . budesonide (PULMICORT) 0.5 MG/2ML nebulizer solution    Sig: Make a 240cc of saline in a neilmed bottle using the salt packs then add 2cc vial of budesonide to the rinse bottle and mix, do twice daily    Dispense:  120 mL    Refill:  5    Patient Instructions  Asthma Continue Symbicort 160-2  puffs twice a day with a spacer to prevent cough and wheeze Continue montelukast 10 mg once a day to prevent cough and wheeze Continue albuterol 2 puffs every 4 hours as needed. You may use 2 puffs 5-15 minutes before exercise to reduce cough and wheeze Continue Xolair injections every 2 weeks  Allergic rhinitis Prednisone 10 mg tablets. Take 2 tablets twice a day for 3 days, take 2 tablets for 1 day, then take 1 tablet on the 5th day, then stopBegin budesonide and saline nasal rinses twice a day. Mix 240 cc of saline in the NeilMed bottle using the salt packets. Then add the entire vial of budesonide to the rinse bottle. Use twice a day. This will replace Nasonex Begin RyVeny 6 mg twice a day for nasal symptoms. This will replace Xyzal Begin Mucinex [phone] mg twice a day to thin secretions and increase hydration as tolerated  Reflux Continue omeprazole as previously written Continue dietary and lifestyle modifications as written below  Call the clinic if this treatment plan is not working well for you  Follow up in 4 weeks or sooner if needed   Return in about 4 weeks (around 12/17/2018), or if symptoms worsen or fail to improve.    Thank you for the opportunity to care for this patient.  Please do not hesitate to contact me with questions.  Gareth Morgan, FNP [facility] [place]  _________________________________________________  I have provided oversight concerning [facility]'s evaluation and treatment of this patient's health issues addressed during today's encounter.  I agree with the assessment and therapeutic plan as outlined in the note.   Signed,   R Edgar Frisk, MD

## 2018-11-19 NOTE — Patient Instructions (Addendum)
Asthma Continue Symbicort 160-2 puffs twice a day with a spacer to prevent cough and wheeze Continue montelukast 10 mg once a day to prevent cough and wheeze Continue albuterol 2 puffs every 4 hours as needed. You may use 2 puffs 5-15 minutes before exercise to reduce cough and wheeze Continue Xolair injections every 2 weeks  Allergic rhinitis Prednisone 10 mg tablets. Take 2 tablets twice a day for 3 days, take 2 tablets for 1 day, then take 1 tablet on the 5th day, then stopBegin budesonide and saline nasal rinses twice a day. Mix 240 cc of saline in the NeilMed bottle using the salt packets. Then add the entire vial of budesonide to the rinse bottle. Use twice a day. This will replace Nasonex Begin RyVeny 6 mg twice a day for nasal symptoms. This will replace Xyzal Begin Mucinex [phone] mg twice a day to thin secretions and increase hydration as tolerated  Reflux Continue omeprazole as previously written Continue dietary and lifestyle modifications as written below  Call the clinic if this treatment plan is not working well for you  Follow up in 4 weeks or sooner if needed   Lifestyle Changes for Controlling GERD When you have GERD, stomach acid feels as if it's backing up toward your mouth. Whether or not you take medication to control your GERD, your symptoms can often be improved with lifestyle changes.   Raise Your Head  Reflux is more likely to strike when you're lying down flat, because stomach fluid can  flow backward more easily. Raising the head of your bed 4-6 inches can help. To do this:  Slide blocks or books under the legs at the head of your bed. Or, place a wedge under  the mattress. Many foam stores can make a suitable wedge for you. The wedge  should run from your waist to the top of your head.  Don't just prop your head on several pillows. This increases pressure on your  stomach. It can make GERD worse.  Watch Your Eating Habits Certain foods may  increase the acid in your stomach or relax the lower esophageal sphincter, making GERD more likely. It's best to avoid the following:  Coffee, tea, and carbonated drinks (with and without caffeine)  Fatty, fried, or spicy food  Mint, chocolate, onions, and tomatoes  Any other foods that seem to irritate your stomach or cause you pain  Relieve the Pressure  Eat smaller meals, even if you have to eat more often.  Don't lie down right after you eat. Wait a few hours for your stomach to empty.  Avoid tight belts and tight-fitting clothes.  Lose excess weight.  Tobacco and Alcohol  Avoid smoking tobacco and drinking alcohol. They can make GERD symptoms worse.

## 2018-11-20 ENCOUNTER — Other Ambulatory Visit: Payer: Self-pay

## 2018-11-20 ENCOUNTER — Telehealth: Payer: Self-pay

## 2018-11-20 NOTE — Telephone Encounter (Signed)
Yay!! Thank you!

## 2018-11-20 NOTE — Telephone Encounter (Signed)
Can you please call and find out why they will not cover

## 2018-11-20 NOTE — Telephone Encounter (Signed)
Ambs, Kathrine Cords, FNP  [facility]        Can you please call this patient and check on her allergies and breathing? Thank you     Pt stated the pharmacy told her that they can not do the ryvent rx. They was sending it to another pharmacy.   Pt states she is doing ok.

## 2018-11-20 NOTE — Telephone Encounter (Signed)
Spoke with [facility] it was a mess up on their end. She is eligible and they will be calling her to set up shipment

## 2018-11-20 NOTE — Telephone Encounter (Signed)
ryvent was sent to [facility]

## 2018-11-20 NOTE — Telephone Encounter (Signed)
Did we sent the ryvent to the designated pharm? [facility]? Can you please call them and find out why they cant fill ryvent. Thank you

## 2018-12-03 ENCOUNTER — Other Ambulatory Visit: Payer: Self-pay

## 2018-12-03 ENCOUNTER — Ambulatory Visit (INDEPENDENT_AMBULATORY_CARE_PROVIDER_SITE_OTHER): Payer: Managed Care, Other (non HMO)

## 2018-12-03 DIAGNOSIS — J4540 Moderate persistent asthma, uncomplicated: Secondary | ICD-10-CM

## 2018-12-17 ENCOUNTER — Other Ambulatory Visit: Payer: Self-pay

## 2018-12-17 ENCOUNTER — Ambulatory Visit (INDEPENDENT_AMBULATORY_CARE_PROVIDER_SITE_OTHER): Payer: Managed Care, Other (non HMO)

## 2018-12-17 DIAGNOSIS — J4540 Moderate persistent asthma, uncomplicated: Secondary | ICD-10-CM

## 2018-12-31 ENCOUNTER — Other Ambulatory Visit: Payer: Self-pay

## 2018-12-31 ENCOUNTER — Ambulatory Visit (INDEPENDENT_AMBULATORY_CARE_PROVIDER_SITE_OTHER): Payer: Managed Care, Other (non HMO)

## 2018-12-31 DIAGNOSIS — J4540 Moderate persistent asthma, uncomplicated: Secondary | ICD-10-CM | POA: Diagnosis not present

## 2019-01-01 ENCOUNTER — Other Ambulatory Visit: Payer: Self-pay | Admitting: *Deleted

## 2019-01-01 DIAGNOSIS — Z1231 Encounter for screening mammogram for malignant neoplasm of breast: Secondary | ICD-10-CM

## 2019-01-06 ENCOUNTER — Other Ambulatory Visit: Payer: Self-pay | Admitting: Allergy

## 2019-01-14 ENCOUNTER — Other Ambulatory Visit: Payer: Self-pay

## 2019-01-14 ENCOUNTER — Ambulatory Visit (INDEPENDENT_AMBULATORY_CARE_PROVIDER_SITE_OTHER): Payer: Managed Care, Other (non HMO)

## 2019-01-14 DIAGNOSIS — J4540 Moderate persistent asthma, uncomplicated: Secondary | ICD-10-CM | POA: Diagnosis not present

## 2019-01-28 ENCOUNTER — Other Ambulatory Visit: Payer: Self-pay

## 2019-01-28 ENCOUNTER — Ambulatory Visit (INDEPENDENT_AMBULATORY_CARE_PROVIDER_SITE_OTHER): Payer: Managed Care, Other (non HMO)

## 2019-01-28 DIAGNOSIS — J4540 Moderate persistent asthma, uncomplicated: Secondary | ICD-10-CM

## 2019-02-16 ENCOUNTER — Ambulatory Visit (INDEPENDENT_AMBULATORY_CARE_PROVIDER_SITE_OTHER): Payer: Managed Care, Other (non HMO)

## 2019-02-16 ENCOUNTER — Other Ambulatory Visit: Payer: Self-pay

## 2019-02-16 DIAGNOSIS — J4540 Moderate persistent asthma, uncomplicated: Secondary | ICD-10-CM | POA: Diagnosis not present

## 2019-02-19 ENCOUNTER — Other Ambulatory Visit: Payer: Self-pay

## 2019-02-19 ENCOUNTER — Ambulatory Visit
Admission: RE | Admit: 2019-02-19 | Discharge: 2019-02-19 | Disposition: A | Discharge status: Home / Self Care | Payer: Managed Care, Other (non HMO) | Source: Ambulatory Visit | Attending: *Deleted | Admitting: *Deleted

## 2019-02-19 DIAGNOSIS — Z1231 Encounter for screening mammogram for malignant neoplasm of breast: Secondary | ICD-10-CM

## 2019-03-02 ENCOUNTER — Other Ambulatory Visit: Payer: Self-pay

## 2019-03-02 ENCOUNTER — Ambulatory Visit (INDEPENDENT_AMBULATORY_CARE_PROVIDER_SITE_OTHER): Payer: Managed Care, Other (non HMO)

## 2019-03-02 DIAGNOSIS — J4540 Moderate persistent asthma, uncomplicated: Secondary | ICD-10-CM | POA: Diagnosis not present

## 2019-03-16 ENCOUNTER — Other Ambulatory Visit: Payer: Self-pay

## 2019-03-16 ENCOUNTER — Ambulatory Visit (INDEPENDENT_AMBULATORY_CARE_PROVIDER_SITE_OTHER): Payer: Managed Care, Other (non HMO)

## 2019-03-16 DIAGNOSIS — J4540 Moderate persistent asthma, uncomplicated: Secondary | ICD-10-CM | POA: Diagnosis not present

## 2019-03-30 ENCOUNTER — Ambulatory Visit: Payer: Self-pay

## 2019-03-31 ENCOUNTER — Ambulatory Visit (INDEPENDENT_AMBULATORY_CARE_PROVIDER_SITE_OTHER): Payer: Managed Care, Other (non HMO)

## 2019-03-31 ENCOUNTER — Other Ambulatory Visit: Payer: Self-pay

## 2019-03-31 DIAGNOSIS — J4540 Moderate persistent asthma, uncomplicated: Secondary | ICD-10-CM

## 2019-04-13 ENCOUNTER — Other Ambulatory Visit: Payer: Self-pay

## 2019-04-13 ENCOUNTER — Ambulatory Visit (INDEPENDENT_AMBULATORY_CARE_PROVIDER_SITE_OTHER): Payer: Managed Care, Other (non HMO)

## 2019-04-13 DIAGNOSIS — J4550 Severe persistent asthma, uncomplicated: Secondary | ICD-10-CM

## 2019-04-13 DIAGNOSIS — J4540 Moderate persistent asthma, uncomplicated: Secondary | ICD-10-CM

## 2019-04-23 HISTORY — PX: ROTATOR CUFF REPAIR: SHX139

## 2019-04-27 ENCOUNTER — Ambulatory Visit (INDEPENDENT_AMBULATORY_CARE_PROVIDER_SITE_OTHER): Payer: Managed Care, Other (non HMO)

## 2019-04-27 ENCOUNTER — Other Ambulatory Visit: Payer: Self-pay

## 2019-04-27 ENCOUNTER — Ambulatory Visit: Payer: Self-pay

## 2019-04-27 DIAGNOSIS — J4540 Moderate persistent asthma, uncomplicated: Secondary | ICD-10-CM

## 2019-05-11 ENCOUNTER — Ambulatory Visit (INDEPENDENT_AMBULATORY_CARE_PROVIDER_SITE_OTHER): Payer: Managed Care, Other (non HMO)

## 2019-05-11 ENCOUNTER — Other Ambulatory Visit: Payer: Self-pay

## 2019-05-11 DIAGNOSIS — J4540 Moderate persistent asthma, uncomplicated: Secondary | ICD-10-CM

## 2019-05-27 ENCOUNTER — Ambulatory Visit (INDEPENDENT_AMBULATORY_CARE_PROVIDER_SITE_OTHER): Payer: Managed Care, Other (non HMO)

## 2019-05-27 ENCOUNTER — Other Ambulatory Visit: Payer: Self-pay

## 2019-05-27 DIAGNOSIS — J4540 Moderate persistent asthma, uncomplicated: Secondary | ICD-10-CM | POA: Diagnosis not present

## 2019-06-08 ENCOUNTER — Other Ambulatory Visit: Payer: Self-pay

## 2019-06-08 ENCOUNTER — Ambulatory Visit (INDEPENDENT_AMBULATORY_CARE_PROVIDER_SITE_OTHER): Payer: Managed Care, Other (non HMO)

## 2019-06-08 DIAGNOSIS — J4550 Severe persistent asthma, uncomplicated: Secondary | ICD-10-CM | POA: Diagnosis not present

## 2019-06-08 DIAGNOSIS — J4540 Moderate persistent asthma, uncomplicated: Secondary | ICD-10-CM

## 2019-06-24 ENCOUNTER — Other Ambulatory Visit: Payer: Self-pay

## 2019-06-24 ENCOUNTER — Ambulatory Visit (INDEPENDENT_AMBULATORY_CARE_PROVIDER_SITE_OTHER): Payer: Managed Care, Other (non HMO)

## 2019-06-24 DIAGNOSIS — J4540 Moderate persistent asthma, uncomplicated: Secondary | ICD-10-CM | POA: Diagnosis not present

## 2019-07-03 ENCOUNTER — Other Ambulatory Visit: Payer: Self-pay | Admitting: Allergy

## 2019-07-06 ENCOUNTER — Ambulatory Visit: Payer: Self-pay

## 2019-07-07 ENCOUNTER — Other Ambulatory Visit: Payer: Self-pay | Admitting: Allergy

## 2019-07-15 ENCOUNTER — Other Ambulatory Visit: Payer: Self-pay

## 2019-07-15 ENCOUNTER — Ambulatory Visit (INDEPENDENT_AMBULATORY_CARE_PROVIDER_SITE_OTHER): Payer: Managed Care, Other (non HMO) | Admitting: *Deleted

## 2019-07-15 DIAGNOSIS — J4540 Moderate persistent asthma, uncomplicated: Secondary | ICD-10-CM

## 2019-07-21 ENCOUNTER — Other Ambulatory Visit: Payer: Self-pay

## 2019-07-21 ENCOUNTER — Ambulatory Visit (INDEPENDENT_AMBULATORY_CARE_PROVIDER_SITE_OTHER): Payer: Managed Care, Other (non HMO) | Admitting: Family Medicine

## 2019-07-21 ENCOUNTER — Encounter: Payer: Self-pay | Admitting: Family Medicine

## 2019-07-21 VITALS — BP 118/78 | HR 92 | Temp 98.5°F | Resp 16 | Ht 66.0 in | Wt 226.2 lb

## 2019-07-21 DIAGNOSIS — J4540 Moderate persistent asthma, uncomplicated: Secondary | ICD-10-CM

## 2019-07-21 DIAGNOSIS — J309 Allergic rhinitis, unspecified: Secondary | ICD-10-CM | POA: Diagnosis not present

## 2019-07-21 DIAGNOSIS — K219 Gastro-esophageal reflux disease without esophagitis: Secondary | ICD-10-CM

## 2019-07-21 DIAGNOSIS — H1010 Acute atopic conjunctivitis, unspecified eye: Secondary | ICD-10-CM

## 2019-07-21 NOTE — Progress Notes (Addendum)
[place] [id number] Dept: [phone]  FOLLOW UP NOTE  Patient ID: Dorothy Baker. [place], female    DOB: October 06, 1965  Age: 54 y.o. MRN: [id number] Date of Office Visit: 07/21/2019  Assessment  Chief Complaint: Asthma (need clearance for rotator cuff surgery from orthopedic office)  HPI Dorothy Baker is a 54 year old female who presents to the clinic for a follow up visit. She was last seen in this clinic on 11/19/2018 for evaluation of asthma, allergic rhinitis, and reflux. At today's visit, she reports her asthma has been well controlled with no shortness of breath, cough or wheeze with activity or rest. She continues montelukast 10 mg once a day, Symbicort 160-2 puffs once a day with a spacer, Xolair injections 375 mg once every 2 weeks, and rarely needs her albuterol inhaler. Allergic rhinitis is reported as well controlled with Xyzal, Nasonex, and saline nasal rinses. Reflux is reported as well controlled with no heartburn or vomiting with omeprazole 20 mg on most days. Her current medications are listed in the chart.   Drug Allergies:  Allergies  Allergen Reactions  . Aspirin Shortness Of Breath, Cough and Other (See Comments)    Asthma flair up Asthma attack Asthma flair up  . Other Diarrhea, Nausea And Vomiting and Other (See Comments)    Peanut intolerance  . Ciprofloxacin Other (See Comments)  . Quinolones Other (See Comments)    Questionable - muscle pain     Physical Exam: BP 118/78   Pulse 92   Temp 98.5 F (36.9 C) (Oral)   Resp 16   Ht 5\' 6"  (1.676 m)   Wt 226 lb 3.1 oz (102.6 kg)   BMI 36.51 kg/m    Physical Exam Vitals reviewed.  Constitutional:      Appearance: Normal appearance.  HENT:     Head: Normocephalic and atraumatic.     Right Ear: Tympanic membrane normal.     Left Ear: Tympanic membrane normal.     Nose:     Comments: Bilateral nares normal. Pharynx normal. Eyes normal.Left TM retracted. Right ear normal with  tympanostomy tube noted.     Mouth/Throat:     Pharynx: Oropharynx is clear.  Eyes:     Conjunctiva/sclera: Conjunctivae normal.  Cardiovascular:     Rate and Rhythm: Normal rate and regular rhythm.     Heart sounds: Normal heart sounds. No murmur.  Pulmonary:     Effort: Pulmonary effort is normal.     Breath sounds: Normal breath sounds.     Comments: Lungs clear to auscultation Musculoskeletal:        General: Normal range of motion.     Cervical back: Normal range of motion and neck supple.  Skin:    General: Skin is warm and dry.  Neurological:     Mental Status: She is alert and oriented to person, place, and time.  Psychiatric:        Mood and Affect: Mood normal.        Behavior: Behavior normal.        Thought Content: Thought content normal.        Judgment: Judgment normal.     Diagnostics: FVC 2.69, FEV1 1.85. Predicted FVC 3.12, predicted FEV1 2.48. Spirometry indicates normal ventilatory function.    Assessment and Plan: 1. Moderate persistent asthma without complication   2. Allergic rhinoconjunctivitis   3. Gastroesophageal reflux disease, unspecified whether esophagitis present     Patient Instructions  Asthma Continue  montelukast 10 mg once a day to prevent cough or wheeze Continue Symbicort 2 puffs once or twice a day with a spacer to prevent cough or wheeze Continue Xolair injections 375 mg once every 2 weeks Continue albuterol 2 puffs every 4 hours as needed for cough or wheeze  Allergic rhinitis Continue Xyzal 5 mg once a day as needed for a runny nose Continue Nasonex 2 sprays in each nostril once a day as needed for a stuffy nose Consider saline nasal rinses as needed for nasal symptoms. Use this before any medicated nasal sprays for best result  Reflux Continue omeprazole 20 mg once a day to control reflux Continue dietary and lifestyle modifications as listed below  Call the clinic if this treatment plan is not working well for you   Follow up in 6 months or sooner if needed.   Return in about 6 months (around 01/20/2020), or if symptoms worsen or fail to improve.    Thank you for the opportunity to care for this patient.  Please do not hesitate to contact me with questions.  Gareth Morgan, FNP [facility] [place]  ________________________________________________  I have provided oversight concerning [facility]'s evaluation and treatment of this patient's health issues addressed during today's encounter.  I agree with the assessment and therapeutic plan as outlined in the note.   Signed,   R Edgar Frisk, MD

## 2019-07-21 NOTE — Patient Instructions (Addendum)
Asthma Continue montelukast 10 mg once a day to prevent cough or wheeze Continue Symbicort 2 puffs once or twice a day with a spacer to prevent cough or wheeze Continue Xolair injections 375 mg once every 2 weeks Continue albuterol 2 puffs every 4 hours as needed for cough or wheeze  Allergic rhinitis Continue Xyzal 5 mg once a day as needed for a runny nose Continue Nasonex 2 sprays in each nostril once a day as needed for a stuffy nose Consider saline nasal rinses as needed for nasal symptoms. Use this before any medicated nasal sprays for best result  Reflux Continue omeprazole 20 mg once a day to control reflux Continue dietary and lifestyle modifications as listed below  Call the clinic if this treatment plan is not working well for you  Follow up in 6 months or sooner if needed.

## 2019-07-29 ENCOUNTER — Other Ambulatory Visit: Payer: Self-pay

## 2019-07-29 ENCOUNTER — Ambulatory Visit (INDEPENDENT_AMBULATORY_CARE_PROVIDER_SITE_OTHER): Payer: Managed Care, Other (non HMO)

## 2019-07-29 DIAGNOSIS — J4540 Moderate persistent asthma, uncomplicated: Secondary | ICD-10-CM

## 2019-08-10 ENCOUNTER — Other Ambulatory Visit: Payer: Self-pay

## 2019-08-10 ENCOUNTER — Ambulatory Visit (INDEPENDENT_AMBULATORY_CARE_PROVIDER_SITE_OTHER): Payer: Managed Care, Other (non HMO) | Admitting: *Deleted

## 2019-08-10 DIAGNOSIS — J4540 Moderate persistent asthma, uncomplicated: Secondary | ICD-10-CM | POA: Diagnosis not present

## 2019-08-23 ENCOUNTER — Other Ambulatory Visit: Payer: Self-pay | Admitting: Allergy

## 2019-08-23 NOTE — Telephone Encounter (Signed)
Please advise 

## 2019-08-24 ENCOUNTER — Ambulatory Visit (INDEPENDENT_AMBULATORY_CARE_PROVIDER_SITE_OTHER): Payer: Managed Care, Other (non HMO)

## 2019-08-24 ENCOUNTER — Other Ambulatory Visit: Payer: Self-pay

## 2019-08-24 DIAGNOSIS — J4540 Moderate persistent asthma, uncomplicated: Secondary | ICD-10-CM | POA: Diagnosis not present

## 2019-08-27 ENCOUNTER — Other Ambulatory Visit: Payer: Self-pay | Admitting: Otolaryngology

## 2019-08-27 DIAGNOSIS — E041 Nontoxic single thyroid nodule: Secondary | ICD-10-CM

## 2019-09-07 ENCOUNTER — Ambulatory Visit: Payer: Self-pay

## 2019-09-09 ENCOUNTER — Ambulatory Visit (INDEPENDENT_AMBULATORY_CARE_PROVIDER_SITE_OTHER): Payer: Managed Care, Other (non HMO)

## 2019-09-09 DIAGNOSIS — J4540 Moderate persistent asthma, uncomplicated: Secondary | ICD-10-CM

## 2019-09-23 ENCOUNTER — Other Ambulatory Visit: Payer: Self-pay

## 2019-09-23 ENCOUNTER — Ambulatory Visit (INDEPENDENT_AMBULATORY_CARE_PROVIDER_SITE_OTHER): Payer: Managed Care, Other (non HMO)

## 2019-09-23 DIAGNOSIS — J4540 Moderate persistent asthma, uncomplicated: Secondary | ICD-10-CM

## 2019-09-24 ENCOUNTER — Other Ambulatory Visit: Payer: Self-pay | Admitting: Otolaryngology

## 2019-10-04 NOTE — Pre-Procedure Instructions (Signed)
Your procedure is scheduled on Friday, June 18th 2021 from 09:52 AM- 11:56 AM.  Report to [facility] Entrance "A" at 07:50 A.M., and check in at the Admitting office.  Call this number if you have problems the morning of surgery:  [phone]  Call [phone] if you have any questions prior to your surgery date Monday-Friday 8am-4pm.    Remember:  Do not eat or drink after midnight the night before your surgery.     Take these medicines the morning of surgery with A SIP OF WATER: omeprazole (PRILOSEC) budesonide-formoterol (SYMBICORT) inhaler mometasone (NASONEX) nasal spray  IF NEEDED: albuterol (VENTOLIN HFA) inhaler  Please bring all inhalers with you the day of surgery.    As of today, STOP taking any Aspirin (unless otherwise instructed by your surgeon) and Aspirin containing products, Aleve, Naproxen, Ibuprofen, Motrin, Advil, Goody's, BC's, all herbal medications, fish oil, and all vitamins.    HOW TO MANAGE YOUR DIABETES BEFORE AND AFTER SURGERY  Why is it important to control my blood sugar before and after surgery? . Improving blood sugar levels before and after surgery helps healing and can limit problems. . A way of improving blood sugar control is eating a healthy diet by: o  Eating less sugar and carbohydrates o  Increasing activity/exercise o  Talking with your doctor about reaching your blood sugar goals . High blood sugars (greater than 180 mg/dL) can raise your risk of infections and slow your recovery, so you will need to focus on controlling your diabetes during the weeks before surgery. . Make sure that the doctor who takes care of your diabetes knows about your planned surgery including the date and location.  How do I manage my blood sugar before surgery? . Check your blood sugar at least 4 times a day, starting 2 days before surgery, to make sure that the level is not too high or low. . Check your blood sugar the morning of your surgery when  you wake up and every 2 hours until you get to the Short Stay unit. o If your blood sugar is less than 70 mg/dL, you will need to treat for low blood sugar: - Do not take insulin. - Treat a low blood sugar (less than 70 mg/dL) with  cup of clear juice (cranberry or apple), 4 glucose tablets, OR glucose gel. - Recheck blood sugar in 15 minutes after treatment (to make sure it is greater than 70 mg/dL). If your blood sugar is not greater than 70 mg/dL on recheck, call [phone] for further instructions. . Report your blood sugar to the short stay nurse when you get to Short Stay.  . If you are admitted to the hospital after surgery: o Your blood sugar will be checked by the staff and you will probably be given insulin after surgery (instead of oral diabetes medicines) to make sure you have good blood sugar levels. o The goal for blood sugar control after surgery is 80-180 mg/dL.            The Morning of Surgery:            Do not wear jewelry, make up, or nail polish.            Do not wear lotions, powders, perfumes, or deodorant.            Do not shave 48 hours prior to surgery.              Do not bring  valuables to the hospital.            [facility] is not responsible for any belongings or valuables.  Do NOT Smoke (Tobacco/Vapping) or drink Alcohol 24 hours prior to your procedure.  If you use a CPAP at night, you may bring all equipment for your overnight stay.   Contacts, glasses, dentures or bridgework may not be worn into surgery.      For patients admitted to the hospital, discharge time will be determined by your treatment team.   Patients discharged the day of surgery will not be allowed to drive home, and someone needs to stay with them for 24 hours.    Special instructions:   [facility]- Preparing For Surgery  Before surgery, you can play an important role. Because skin is not sterile, your skin needs to be as free of germs as possible. You can reduce the number  of germs on your skin by washing with CHG (chlorahexidine gluconate) Soap before surgery.  CHG is an antiseptic cleaner which kills germs and bonds with the skin to continue killing germs even after washing.    Oral Hygiene is also important to reduce your risk of infection.  Remember - BRUSH YOUR TEETH THE MORNING OF SURGERY WITH YOUR REGULAR TOOTHPASTE  Please do not use if you have an allergy to CHG or antibacterial soaps. If your skin becomes reddened/irritated stop using the CHG.  Do not shave (including legs and underarms) for at least 48 hours prior to first CHG shower. It is OK to shave your face.  Please follow these instructions carefully.   1. Shower the NIGHT BEFORE SURGERY and the MORNING OF SURGERY with CHG Soap.   2. If you chose to wash your hair, wash your hair first as usual with your normal shampoo.  3. After you shampoo, rinse your hair and body thoroughly to remove the shampoo.  4. Use CHG as you would any other liquid soap. You can apply CHG directly to the skin and wash gently with a scrungie or a clean washcloth.   5. Apply the CHG Soap to your body ONLY FROM THE NECK DOWN.  Do not use on open wounds or open sores. Avoid contact with your eyes, ears, mouth and genitals (private parts). Wash Face and genitals (private parts)  with your normal soap.   6. Wash thoroughly, paying special attention to the area where your surgery will be performed.  7. Thoroughly rinse your body with warm water from the neck down.  8. DO NOT shower/wash with your normal soap after using and rinsing off the CHG Soap.  9. Pat yourself dry with a CLEAN TOWEL.  10. Wear CLEAN PAJAMAS to bed the night before surgery, wear comfortable clothes the morning of surgery  11. Place CLEAN SHEETS on your bed the night of your first shower and DO NOT SLEEP WITH PETS.   Day of Surgery: Shower with CHG Soap.  Do not apply any deodorants/lotions.  Please wear clean clothes to the hospital/surgery  center.   Remember to brush your teeth WITH YOUR REGULAR TOOTHPASTE.   Please read over the following fact sheets that you were given.

## 2019-10-04 NOTE — Progress Notes (Signed)
Per Karoline Caldwell, PA-C no CXR needed for this upcoming thyroidectomy.

## 2019-10-05 ENCOUNTER — Other Ambulatory Visit (HOSPITAL_COMMUNITY)
Admission: RE | Admit: 2019-10-05 | Discharge: 2019-10-05 | Disposition: A | Discharge status: Home / Self Care | Payer: Managed Care, Other (non HMO) | Source: Ambulatory Visit | Attending: Otolaryngology | Admitting: Otolaryngology

## 2019-10-05 ENCOUNTER — Encounter (HOSPITAL_COMMUNITY)
Admission: RE | Admit: 2019-10-05 | Discharge: 2019-10-05 | Disposition: A | Discharge status: Home / Self Care | Payer: Managed Care, Other (non HMO) | Source: Ambulatory Visit | Attending: Otolaryngology | Admitting: Otolaryngology

## 2019-10-05 ENCOUNTER — Encounter (HOSPITAL_COMMUNITY): Payer: Self-pay

## 2019-10-05 ENCOUNTER — Other Ambulatory Visit: Payer: Self-pay

## 2019-10-05 DIAGNOSIS — Z01818 Encounter for other preprocedural examination: Secondary | ICD-10-CM | POA: Diagnosis present

## 2019-10-05 DIAGNOSIS — I10 Essential (primary) hypertension: Secondary | ICD-10-CM | POA: Diagnosis not present

## 2019-10-05 DIAGNOSIS — Z20822 Contact with and (suspected) exposure to covid-19: Secondary | ICD-10-CM | POA: Insufficient documentation

## 2019-10-05 HISTORY — DX: Nontoxic single thyroid nodule: E04.1

## 2019-10-05 HISTORY — DX: Gastro-esophageal reflux disease without esophagitis: K21.9

## 2019-10-05 LAB — BASIC METABOLIC PANEL
Anion gap: 8 (ref 5–15)
BUN: 13 mg/dL (ref 6–20)
CO2: 28 mmol/L (ref 22–32)
Calcium: 9.3 mg/dL (ref 8.9–10.3)
Chloride: 102 mmol/L (ref 98–111)
Creatinine, Ser: 1 mg/dL (ref 0.44–1.00)
GFR calc Af Amer: >60 mL/min (ref >60)
GFR calc non Af Amer: >60 mL/min (ref >60)
Glucose, Bld: 121 mg/dL — ABNORMAL HIGH (ref 70–99)
Potassium: 3.5 mmol/L (ref 3.5–5.1)
Sodium: 138 mmol/L (ref 135–145)

## 2019-10-05 LAB — HEMOGLOBIN A1C
Hgb A1c MFr Bld: 6.9 % — ABNORMAL HIGH (ref 4.8–5.6)
Mean Plasma Glucose: 151.33 mg/dL

## 2019-10-05 LAB — CBC
HCT: 42.9 % (ref 36.0–46.0)
Hemoglobin: 13.5 g/dL (ref 12.0–15.0)
MCH: 27.2 pg (ref 26.0–34.0)
MCHC: 31.5 g/dL (ref 30.0–36.0)
MCV: 86.3 fL (ref 80.0–100.0)
Platelets: 328 10*3/uL (ref 150–400)
RBC: 4.97 MIL/uL (ref 3.87–5.11)
RDW: 15.2 % (ref 11.5–15.5)
WBC: 7.4 10*3/uL (ref 4.0–10.5)
nRBC: 0 % (ref 0.0–0.2)

## 2019-10-05 LAB — GLUCOSE, CAPILLARY: Glucose-Capillary: 112 mg/dL — ABNORMAL HIGH (ref 70–99)

## 2019-10-05 NOTE — Progress Notes (Addendum)
PCP - Katherina Mires, MD Cardiologist - Denies Asthma and Allergy Specialist- Prudy Feeler, MD  PPM/ICD - Denies  Chest x-ray - N/A EKG - 10/05/19 Stress Test - Denies ECHO - Denies Cardiac Cath - Denies  Sleep Study - Denies  Fasting Blood Sugar: ~105 Checks Blood Sugar X1 daily in AM. CBG at [facility] appointment was 112. A1C obtained.  Blood Thinner Instructions: N/A Aspirin Instructions: N/A  ERAS Protcol - No PRE-SURGERY Ensure or G2- N/A  COVID TEST- 10/05/19  Patient is on Naltrexone-buPROPion HCl ER (CONTRAVE) for weight loss. Per Karoline Caldwell, PA-C, patient may stop 3 days prior to surgery. However, per Jeneen Rinks, if patient unable to hold, it should not be a problem from an anesthesia standpoint.  Anesthesia review: No  Patient denies shortness of breath, fever, cough and chest pain at PAT appointment   All instructions explained to the patient, with a verbal understanding of the material. Patient agrees to go over the instructions while at home for a better understanding. Patient also instructed to self quarantine after being tested for COVID-19. The opportunity to ask questions was provided.

## 2019-10-06 LAB — SARS CORONAVIRUS 2 (TAT 6-24 HRS): SARS Coronavirus 2: NEGATIVE

## 2019-10-07 ENCOUNTER — Ambulatory Visit (INDEPENDENT_AMBULATORY_CARE_PROVIDER_SITE_OTHER): Payer: Managed Care, Other (non HMO)

## 2019-10-07 ENCOUNTER — Other Ambulatory Visit: Payer: Self-pay

## 2019-10-07 DIAGNOSIS — J4540 Moderate persistent asthma, uncomplicated: Secondary | ICD-10-CM

## 2019-10-07 MED ORDER — ALBUTEROL SULFATE HFA 108 (90 BASE) MCG/ACT IN AERS: 2.0000 | INHALATION_SPRAY | Freq: Four times a day (QID) | RESPIRATORY_TRACT | 1 refills | Status: DC | PRN

## 2019-10-07 NOTE — Anesthesia Preprocedure Evaluation (Addendum)
Anesthesia Evaluation  Patient identified by MRN, date of birth, ID band Patient awake    Reviewed: Allergy & Precautions, NPO status , Patient's Chart, lab work & pertinent test results  History of Anesthesia Complications Negative for: history of anesthetic complications  Airway Mallampati: II  TM Distance: >3 FB Neck ROM: Full    Dental  (+) Dental Advisory Given, Teeth Intact   Pulmonary asthma ,    Pulmonary exam normal        Cardiovascular hypertension, Pt. on medications Normal cardiovascular exam     Neuro/Psych negative neurological ROS  negative psych ROS   GI/Hepatic Neg liver ROS, GERD  Medicated and Controlled,  Endo/Other  diabetes Obesity Cystic right thyroid nodule   Renal/GU negative Renal ROS     Musculoskeletal negative musculoskeletal ROS (+)   Abdominal   Peds  Hematology negative hematology ROS (+)   Anesthesia Other Findings Covid test negative   Reproductive/Obstetrics                            Anesthesia Physical Anesthesia Plan  ASA: II  Anesthesia Plan: General   Post-op Pain Management:    Induction: Intravenous  PONV Risk Score and Plan: 3 and Treatment may vary due to age or medical condition, Ondansetron, Dexamethasone and Midazolam  Airway Management Planned: Oral ETT  Additional Equipment: None  Intra-op Plan:   Post-operative Plan: Extubation in OR  Informed Consent: I have reviewed the patients History and Physical, chart, labs and discussed the procedure including the risks, benefits and alternatives for the proposed anesthesia with the patient or authorized representative who has indicated his/her understanding and acceptance.     Dental advisory given  Plan Discussed with: CRNA and Anesthesiologist  Anesthesia Plan Comments:        Anesthesia Quick Evaluation

## 2019-10-08 ENCOUNTER — Ambulatory Visit (HOSPITAL_COMMUNITY): Payer: Managed Care, Other (non HMO) | Admitting: Physician Assistant

## 2019-10-08 ENCOUNTER — Encounter (HOSPITAL_COMMUNITY)
Admission: RE | Disposition: A | Discharge status: Home / Self Care | Payer: Self-pay | Source: Home / Self Care | Attending: Otolaryngology

## 2019-10-08 ENCOUNTER — Observation Stay (HOSPITAL_COMMUNITY)
Admission: RE | Admit: 2019-10-08 | Discharge: 2019-10-09 | Disposition: A | Discharge status: Home / Self Care | Payer: Managed Care, Other (non HMO) | Attending: Otolaryngology | Admitting: Otolaryngology

## 2019-10-08 ENCOUNTER — Other Ambulatory Visit: Payer: Self-pay

## 2019-10-08 ENCOUNTER — Encounter (HOSPITAL_COMMUNITY): Payer: Self-pay | Admitting: Otolaryngology

## 2019-10-08 DIAGNOSIS — Z79899 Other long term (current) drug therapy: Secondary | ICD-10-CM | POA: Insufficient documentation

## 2019-10-08 DIAGNOSIS — Z8249 Family history of ischemic heart disease and other diseases of the circulatory system: Secondary | ICD-10-CM | POA: Diagnosis not present

## 2019-10-08 DIAGNOSIS — E119 Type 2 diabetes mellitus without complications: Secondary | ICD-10-CM | POA: Insufficient documentation

## 2019-10-08 DIAGNOSIS — E890 Postprocedural hypothyroidism: Secondary | ICD-10-CM

## 2019-10-08 DIAGNOSIS — D34 Benign neoplasm of thyroid gland: Principal | ICD-10-CM | POA: Insufficient documentation

## 2019-10-08 DIAGNOSIS — K219 Gastro-esophageal reflux disease without esophagitis: Secondary | ICD-10-CM | POA: Insufficient documentation

## 2019-10-08 DIAGNOSIS — Z7951 Long term (current) use of inhaled steroids: Secondary | ICD-10-CM | POA: Insufficient documentation

## 2019-10-08 DIAGNOSIS — E041 Nontoxic single thyroid nodule: Secondary | ICD-10-CM | POA: Diagnosis present

## 2019-10-08 DIAGNOSIS — Z833 Family history of diabetes mellitus: Secondary | ICD-10-CM | POA: Insufficient documentation

## 2019-10-08 DIAGNOSIS — J45909 Unspecified asthma, uncomplicated: Secondary | ICD-10-CM | POA: Insufficient documentation

## 2019-10-08 DIAGNOSIS — I10 Essential (primary) hypertension: Secondary | ICD-10-CM | POA: Diagnosis not present

## 2019-10-08 DIAGNOSIS — Z6835 Body mass index (BMI) 35.0-35.9, adult: Secondary | ICD-10-CM | POA: Diagnosis not present

## 2019-10-08 DIAGNOSIS — E669 Obesity, unspecified: Secondary | ICD-10-CM | POA: Insufficient documentation

## 2019-10-08 HISTORY — PX: THYROIDECTOMY: SHX17

## 2019-10-08 LAB — GLUCOSE, CAPILLARY
Glucose-Capillary: 117 mg/dL — ABNORMAL HIGH (ref 70–99)
Glucose-Capillary: 134 mg/dL — ABNORMAL HIGH (ref 70–99)
Glucose-Capillary: 209 mg/dL — ABNORMAL HIGH (ref 70–99)
Glucose-Capillary: 240 mg/dL — ABNORMAL HIGH (ref 70–99)

## 2019-10-08 SURGERY — THYROIDECTOMY
Anesthesia: General | Site: Neck | Laterality: Right

## 2019-10-08 MED ORDER — FENTANYL CITRATE (PF) 250 MCG/5ML IJ SOLN
INTRAMUSCULAR | Status: DC | PRN
  Administered 2019-10-08: 50 ug via INTRAVENOUS
  Administered 2019-10-08: 100 ug via INTRAVENOUS
  Administered 2019-10-08 (×2): 50 ug via INTRAVENOUS
  Administered 2019-10-08: 100 ug via INTRAVENOUS
  Administered 2019-10-08: 50 ug via INTRAVENOUS

## 2019-10-08 MED ORDER — IRBESARTAN 150 MG PO TABS
150.0000 mg | ORAL_TABLET | Freq: Every day | ORAL | Status: DC
  Administered 2019-10-08: 150 mg via ORAL
  Filled 2019-10-08: qty 1

## 2019-10-08 MED ORDER — LIDOCAINE-EPINEPHRINE 1 %-1:100000 IJ SOLN
INTRAMUSCULAR | Status: DC | PRN
  Administered 2019-10-08: 3 mL

## 2019-10-08 MED ORDER — MORPHINE SULFATE (PF) 2 MG/ML IV SOLN
INTRAVENOUS | Status: AC
  Filled 2019-10-08: qty 2

## 2019-10-08 MED ORDER — LIDOCAINE 2% (20 MG/ML) 5 ML SYRINGE
INTRAMUSCULAR | Status: DC | PRN
  Administered 2019-10-08: 20 mg via INTRAVENOUS
  Administered 2019-10-08: 60 mg via INTRAVENOUS

## 2019-10-08 MED ORDER — INSULIN ASPART 100 UNIT/ML ~~LOC~~ SOLN
0.0000 [IU] | Freq: Three times a day (TID) | SUBCUTANEOUS | Status: DC
  Administered 2019-10-08: 5 [IU] via SUBCUTANEOUS
  Administered 2019-10-09: 2 [IU] via SUBCUTANEOUS

## 2019-10-08 MED ORDER — MONTELUKAST SODIUM 10 MG PO TABS
10.0000 mg | ORAL_TABLET | Freq: Every day | ORAL | Status: DC
  Administered 2019-10-08: 10 mg via ORAL
  Filled 2019-10-08: qty 1

## 2019-10-08 MED ORDER — AMITRIPTYLINE HCL 25 MG PO TABS: 25.0000 mg | ORAL_TABLET | Freq: Every evening | ORAL | Status: DC | PRN

## 2019-10-08 MED ORDER — LACTATED RINGERS IV SOLN: INTRAVENOUS | Status: DC

## 2019-10-08 MED ORDER — 0.9 % SODIUM CHLORIDE (POUR BTL) OPTIME
TOPICAL | Status: DC | PRN
  Administered 2019-10-08: 1000 mL

## 2019-10-08 MED ORDER — PROPOFOL 10 MG/ML IV BOLUS
INTRAVENOUS | Status: DC | PRN
  Administered 2019-10-08: 200 mg via INTRAVENOUS

## 2019-10-08 MED ORDER — POTASSIUM CHLORIDE IN NACL 20-0.45 MEQ/L-% IV SOLN
INTRAVENOUS | Status: DC
  Filled 2019-10-08 (×2): qty 1000

## 2019-10-08 MED ORDER — DEXAMETHASONE SODIUM PHOSPHATE 10 MG/ML IJ SOLN
INTRAMUSCULAR | Status: AC
  Filled 2019-10-08: qty 1

## 2019-10-08 MED ORDER — MORPHINE SULFATE (PF) 4 MG/ML IV SOLN
INTRAVENOUS | Status: AC
  Filled 2019-10-08: qty 1

## 2019-10-08 MED ORDER — ADULT MULTIVITAMIN W/MINERALS CH: 1.0000 | ORAL_TABLET | Freq: Every day | ORAL | Status: DC

## 2019-10-08 MED ORDER — OXYCODONE HCL 5 MG/5ML PO SOLN: 5.0000 mg | Freq: Once | ORAL | Status: DC | PRN

## 2019-10-08 MED ORDER — FENTANYL CITRATE (PF) 100 MCG/2ML IJ SOLN
INTRAMUSCULAR | Status: AC
  Filled 2019-10-08: qty 2

## 2019-10-08 MED ORDER — ROCURONIUM BROMIDE 10 MG/ML (PF) SYRINGE
PREFILLED_SYRINGE | INTRAVENOUS | Status: AC
  Filled 2019-10-08: qty 10

## 2019-10-08 MED ORDER — NALTREXONE-BUPROPION HCL ER 8-90 MG PO TB12: 1.0000 | ORAL_TABLET | ORAL | Status: DC

## 2019-10-08 MED ORDER — INSULIN ASPART 100 UNIT/ML ~~LOC~~ SOLN
0.0000 [IU] | Freq: Every day | SUBCUTANEOUS | Status: DC
  Administered 2019-10-08: 2 [IU] via SUBCUTANEOUS

## 2019-10-08 MED ORDER — LEVOCETIRIZINE DIHYDROCHLORIDE 5 MG PO TABS: 5.0000 mg | ORAL_TABLET | Freq: Every evening | ORAL | Status: DC

## 2019-10-08 MED ORDER — VALSARTAN-HYDROCHLOROTHIAZIDE 160-12.5 MG PO TABS: 1.0000 | ORAL_TABLET | Freq: Every day | ORAL | Status: DC

## 2019-10-08 MED ORDER — DEXAMETHASONE SODIUM PHOSPHATE 10 MG/ML IJ SOLN
INTRAMUSCULAR | Status: DC | PRN
  Administered 2019-10-08: 8 mg via INTRAVENOUS

## 2019-10-08 MED ORDER — MIDAZOLAM HCL 2 MG/2ML IJ SOLN
INTRAMUSCULAR | Status: DC | PRN
  Administered 2019-10-08: 2 mg via INTRAVENOUS

## 2019-10-08 MED ORDER — FENTANYL CITRATE (PF) 250 MCG/5ML IJ SOLN
INTRAMUSCULAR | Status: AC
  Filled 2019-10-08: qty 5

## 2019-10-08 MED ORDER — HYDROCODONE-ACETAMINOPHEN 5-325 MG PO TABS
1.0000 | ORAL_TABLET | ORAL | Status: DC | PRN
  Administered 2019-10-08 – 2019-10-09 (×3): 2 via ORAL
  Filled 2019-10-08 (×3): qty 2

## 2019-10-08 MED ORDER — SUGAMMADEX SODIUM 200 MG/2ML IV SOLN
INTRAVENOUS | Status: DC | PRN
  Administered 2019-10-08: 200 mg via INTRAVENOUS

## 2019-10-08 MED ORDER — OXYCODONE HCL 5 MG PO TABS: 5.0000 mg | ORAL_TABLET | Freq: Once | ORAL | Status: DC | PRN

## 2019-10-08 MED ORDER — MOMETASONE FURO-FORMOTEROL FUM 200-5 MCG/ACT IN AERO
2.0000 | INHALATION_SPRAY | Freq: Two times a day (BID) | RESPIRATORY_TRACT | Status: DC
  Filled 2019-10-08 (×2): qty 8.8

## 2019-10-08 MED ORDER — PANTOPRAZOLE SODIUM 40 MG PO TBEC
40.0000 mg | DELAYED_RELEASE_TABLET | Freq: Every day | ORAL | Status: DC
  Administered 2019-10-08: 40 mg via ORAL
  Filled 2019-10-08: qty 1

## 2019-10-08 MED ORDER — ORAL CARE MOUTH RINSE: 15.0000 mL | Freq: Once | OROMUCOSAL | Status: AC

## 2019-10-08 MED ORDER — ONDANSETRON HCL 4 MG/2ML IJ SOLN
INTRAMUSCULAR | Status: AC
  Filled 2019-10-08: qty 2

## 2019-10-08 MED ORDER — ALBUTEROL SULFATE (2.5 MG/3ML) 0.083% IN NEBU: 3.0000 mL | INHALATION_SOLUTION | Freq: Four times a day (QID) | RESPIRATORY_TRACT | Status: DC | PRN

## 2019-10-08 MED ORDER — CHLORHEXIDINE GLUCONATE 0.12 % MT SOLN
15.0000 mL | Freq: Once | OROMUCOSAL | Status: AC
  Administered 2019-10-08: 15 mL via OROMUCOSAL
  Filled 2019-10-08: qty 15

## 2019-10-08 MED ORDER — LIDOCAINE 2% (20 MG/ML) 5 ML SYRINGE
INTRAMUSCULAR | Status: AC
  Filled 2019-10-08: qty 5

## 2019-10-08 MED ORDER — AMPHETAMINE-DEXTROAMPHET ER 10 MG PO CP24: 30.0000 mg | ORAL_CAPSULE | Freq: Every day | ORAL | Status: DC

## 2019-10-08 MED ORDER — VITAMIN D3 25 MCG (1000 UNIT) PO TABS
5000.0000 [IU] | ORAL_TABLET | Freq: Every day | ORAL | Status: DC
  Filled 2019-10-08 (×2): qty 5

## 2019-10-08 MED ORDER — EPHEDRINE 5 MG/ML INJ
INTRAVENOUS | Status: AC
  Filled 2019-10-08: qty 10

## 2019-10-08 MED ORDER — PROPOFOL 10 MG/ML IV BOLUS
INTRAVENOUS | Status: AC
  Filled 2019-10-08: qty 20

## 2019-10-08 MED ORDER — PROMETHAZINE HCL 25 MG/ML IJ SOLN: 6.2500 mg | INTRAMUSCULAR | Status: DC | PRN

## 2019-10-08 MED ORDER — LORATADINE 10 MG PO TABS
10.0000 mg | ORAL_TABLET | Freq: Every evening | ORAL | Status: DC
  Administered 2019-10-08: 10 mg via ORAL
  Filled 2019-10-08: qty 1

## 2019-10-08 MED ORDER — FENTANYL CITRATE (PF) 100 MCG/2ML IJ SOLN
25.0000 ug | INTRAMUSCULAR | Status: DC | PRN
  Administered 2019-10-08 (×3): 50 ug via INTRAVENOUS

## 2019-10-08 MED ORDER — ONDANSETRON HCL 4 MG/2ML IJ SOLN
INTRAMUSCULAR | Status: DC | PRN
  Administered 2019-10-08: 4 mg via INTRAVENOUS

## 2019-10-08 MED ORDER — HYDROCHLOROTHIAZIDE 12.5 MG PO CAPS
12.5000 mg | ORAL_CAPSULE | Freq: Every day | ORAL | Status: DC
  Administered 2019-10-08: 12.5 mg via ORAL
  Filled 2019-10-08: qty 1

## 2019-10-08 MED ORDER — ROCURONIUM BROMIDE 10 MG/ML (PF) SYRINGE
PREFILLED_SYRINGE | INTRAVENOUS | Status: DC | PRN
  Administered 2019-10-08: 60 mg via INTRAVENOUS

## 2019-10-08 MED ORDER — CEFAZOLIN SODIUM-DEXTROSE 2-4 GM/100ML-% IV SOLN
2.0000 g | INTRAVENOUS | Status: AC
  Administered 2019-10-08: 2 g via INTRAVENOUS
  Filled 2019-10-08: qty 100

## 2019-10-08 MED ORDER — HYDROCODONE-ACETAMINOPHEN 5-325 MG PO TABS: 1.0000 | ORAL_TABLET | Freq: Four times a day (QID) | ORAL | 0 refills | Status: DC | PRN

## 2019-10-08 MED ORDER — MIDAZOLAM HCL 2 MG/2ML IJ SOLN
INTRAMUSCULAR | Status: AC
  Filled 2019-10-08: qty 2

## 2019-10-08 MED ORDER — EPHEDRINE SULFATE-NACL 50-0.9 MG/10ML-% IV SOSY
PREFILLED_SYRINGE | INTRAVENOUS | Status: DC | PRN
  Administered 2019-10-08: 10 mg via INTRAVENOUS

## 2019-10-08 MED ORDER — MORPHINE SULFATE (PF) 2 MG/ML IV SOLN
2.0000 mg | INTRAVENOUS | Status: DC | PRN
  Administered 2019-10-08 (×3): 4 mg via INTRAVENOUS
  Filled 2019-10-08 (×2): qty 2

## 2019-10-08 MED ORDER — LIDOCAINE-EPINEPHRINE 1 %-1:100000 IJ SOLN
INTRAMUSCULAR | Status: AC
  Filled 2019-10-08: qty 1

## 2019-10-08 SURGICAL SUPPLY — 43 items
BLADE SURG 15 STRL LF DISP TIS (BLADE) IMPLANT
BLADE SURG 15 STRL SS (BLADE)
CANISTER SUCT 3000ML PPV (MISCELLANEOUS) ×3 IMPLANT
CLEANER TIP ELECTROSURG 2X2 (MISCELLANEOUS) ×3 IMPLANT
CNTNR URN SCR LID CUP LEK RST (MISCELLANEOUS) IMPLANT
CONT SPEC 4OZ STRL OR WHT (MISCELLANEOUS)
CORD BIPOLAR FORCEPS 12FT (ELECTRODE) ×3 IMPLANT
COVER SURGICAL LIGHT HANDLE (MISCELLANEOUS) ×3 IMPLANT
COVER WAND RF STERILE (DRAPES) ×3 IMPLANT
DERMABOND ADVANCED (GAUZE/BANDAGES/DRESSINGS) ×2
DERMABOND ADVANCED .7 DNX12 (GAUZE/BANDAGES/DRESSINGS) ×1 IMPLANT
DRAIN JACKSON RD 7FR 3/32 (WOUND CARE) IMPLANT
DRAIN SNY 10 ROU (WOUND CARE) IMPLANT
DRAPE HALF SHEET 40X57 (DRAPES) IMPLANT
ELECT COATED BLADE 2.86 ST (ELECTRODE) ×3 IMPLANT
ELECT REM PT RETURN 9FT ADLT (ELECTROSURGICAL) ×3
ELECTRODE REM PT RTRN 9FT ADLT (ELECTROSURGICAL) ×1 IMPLANT
EVACUATOR SILICONE 100CC (DRAIN) ×3 IMPLANT
FORCEPS BIPOLAR SPETZLER 8 1.0 (NEUROSURGERY SUPPLIES) ×3 IMPLANT
GAUZE 4X4 16PLY RFD (DISPOSABLE) ×3 IMPLANT
GLOVE BIO SURGEON STRL SZ7.5 (GLOVE) ×3 IMPLANT
GOWN STRL REUS W/ TWL LRG LVL3 (GOWN DISPOSABLE) ×2 IMPLANT
GOWN STRL REUS W/TWL LRG LVL3 (GOWN DISPOSABLE) ×4
HEMOSTAT SURGICEL 2X14 (HEMOSTASIS) ×3 IMPLANT
KIT BASIN OR (CUSTOM PROCEDURE TRAY) ×3 IMPLANT
KIT TURNOVER KIT B (KITS) ×3 IMPLANT
LOCATOR NERVE 3 VOLT (DISPOSABLE) IMPLANT
NEEDLE HYPO 25GX1X1/2 BEV (NEEDLE) ×3 IMPLANT
NS IRRIG 1000ML POUR BTL (IV SOLUTION) ×3 IMPLANT
PAD ARMBOARD 7.5X6 YLW CONV (MISCELLANEOUS) ×6 IMPLANT
PENCIL SMOKE EVACUATOR (MISCELLANEOUS) ×3 IMPLANT
POSITIONER HEAD DONUT 9IN (MISCELLANEOUS) ×3 IMPLANT
SHEARS HARMONIC 9CM CVD (BLADE) ×3 IMPLANT
SPONGE INTESTINAL PEANUT (DISPOSABLE) IMPLANT
STAPLER VISISTAT 35W (STAPLE) ×3 IMPLANT
SUT ETHILON 2 0 FS 18 (SUTURE) ×3 IMPLANT
SUT SILK 3 0 REEL (SUTURE) ×3 IMPLANT
SUT VIC AB 3-0 SH 27 (SUTURE) ×2
SUT VIC AB 3-0 SH 27X BRD (SUTURE) ×1 IMPLANT
SUT VICRYL 4-0 PS2 18IN ABS (SUTURE) ×3 IMPLANT
TOWEL GREEN STERILE FF (TOWEL DISPOSABLE) ×3 IMPLANT
TRAY ENT MC OR (CUSTOM PROCEDURE TRAY) ×3 IMPLANT
TUBE FEEDING 10FR FLEXIFLO (MISCELLANEOUS) IMPLANT

## 2019-10-08 NOTE — Anesthesia Procedure Notes (Signed)
Procedure Name: Intubation Date/Time: 10/08/2019 9:59 AM Performed by: Verdie Drown, CRNA Pre-anesthesia Checklist: Patient identified, Emergency Drugs available, Suction available and Patient being monitored Patient Re-evaluated:Patient Re-evaluated prior to induction Oxygen Delivery Method: Circle System Utilized Preoxygenation: Pre-oxygenation with 100% oxygen Induction Type: IV induction Ventilation: Mask ventilation without difficulty and Oral airway inserted - appropriate to patient size Laryngoscope Size: Mac and 3 Grade View: Grade II Tube type: Oral Tube size: 7.0 mm Number of attempts: 2 (DL x1 per EMS Student, Norina Buzzard, esophageal intubation. 2nd DL successful) Airway Equipment and Method: Stylet and Oral airway Placement Confirmation: ETT inserted through vocal cords under direct vision,  positive ETCO2 and breath sounds checked- equal and bilateral Secured at: 21 cm Tube secured with: Tape Dental Injury: Teeth and Oropharynx as per pre-operative assessment

## 2019-10-08 NOTE — Op Note (Signed)
Preop diagnosis: Right thyroid nodule Postop diagnosis: same Procedure: Right thyroid lobectomy Surgeon: Redmond Baseman Assist: Sallee Provencal, PA Anesth: General and local with 1% lidocaine with 1:100,000 epinephrine Compl: None Findings: Right-sided thyroid nodule, approximately 5 cm Description:  After discussing risks, benefits, and alternatives, the patient was brought to the operative suite and placed on the operative table in the supine position.  Anesthesia was induced and the patient was intubated by the anesthesia team without difficulty.  The eyes were taped closed and a shoulder roll was placed.  The thyroid incision was marked with a marking pen and injected with local anesthetic.  The neck was prepped and draped in sterile fashion.  The incision was made with a 15 blade scalpel and extended through the subcutaneous and platysma layers with Bovie electrocautery.  The midline raphe of the strap muscles was divided and strap muscles retracted over the right lobe.  Dissection was then performed around the lobe using the harmonic scalpel includig division of the superior and inferior pedicles and dissection around the lateral aspect.  The superior parathyroid gland was visualized and left in place.  Dissection continued around the deep aspect of the lobe while retracting the lobe.  Staying along the capsule, the dissection continued to Berry's ligament that was dissected.  The midline of the gland was then incised with electrocautery allowing removal of the lobe.  Some residual thyroid tissue remained on the right in the depth of the dissection.  The specimen was passed to nursing for pathology.  The recurrent laryngeal nerve was not visualized.  Bleeding was controlled with Bipolar electrocautery after a Valsalva was given.  After irrigating copiously, a strip of Surgicell was laid in the depth of the wound.  The strap muscles were closed with a single 3-0 Vicryl suture.  The platysma layer was closed with 3-0  Vicryl in a simple, interrupted fashion.  The subcutaneous layer was similarly closed with 4-0 Vicryl and the skin was closed with glue.  Drapes were removed and the patient was cleaned off.  She was returned to anesthesia for wake-up, was extubated, and moved to the recovery room in stable condition.

## 2019-10-08 NOTE — Transfer of Care (Signed)
Immediate Anesthesia Transfer of Care Note  Patient: Dorothy Baker. Gehres  Procedure(s) Performed: THYROIDECTOMY (Right Neck)  Patient Location: PACU  Anesthesia Type:General  Level of Consciousness: patient cooperative and responds to stimulation  Airway & Oxygen Therapy: Patient Spontanous Breathing and Patient connected to face mask oxygen  Post-op Assessment: Report given to RN, Post -op Vital signs reviewed and stable and Patient moving all extremities X 4  Post vital signs: Reviewed and stable  Last Vitals:  Vitals Value Taken Time  BP 126/77 10/08/19 1129  Temp    Pulse 89 10/08/19 1132  Resp 25 10/08/19 1132  SpO2 96 % 10/08/19 1132  Vitals shown include unvalidated device data.  Last Pain:  Vitals:   10/08/19 0700  PainSc: 0-No pain      Patients Stated Pain Goal: 4 (19/41/74 0814)  Complications: No complications documented.

## 2019-10-08 NOTE — Brief Op Note (Signed)
10/08/2019  11:17 AM  PATIENT:  Dorothy Baker. Dorothy Baker  54 y.o. female  PRE-OPERATIVE DIAGNOSIS:  thyroid cystic nodule, right  POST-OPERATIVE DIAGNOSIS:  thyroid cystic nodule, right  PROCEDURE:  Procedure(s): THYROIDECTOMY (Right)  SURGEON:  Surgeon(s) and Role:    Melida Quitter, MD - Primary  PHYSICIAN ASSISTANT: Sallee Provencal  ASSISTANTS: none   ANESTHESIA:   general  EBL:  25 mL   BLOOD ADMINISTERED:none  DRAINS: none   LOCAL MEDICATIONS USED:  LIDOCAINE   SPECIMEN:  Source of Specimen:  Right thyroid lobe  DISPOSITION OF SPECIMEN:  PATHOLOGY  COUNTS:  YES  TOURNIQUET:  * No tourniquets in log *  DICTATION: .Note written in EPIC  PLAN OF CARE: Admit for overnight observation  PATIENT DISPOSITION:  PACU - hemodynamically stable.   Delay start of Pharmacological VTE agent (>24hrs) due to surgical blood loss or risk of bleeding: yes

## 2019-10-08 NOTE — Anesthesia Postprocedure Evaluation (Signed)
Anesthesia Post Note  Patient: Dorothy Baker  Procedure(s) Performed: THYROIDECTOMY (Right Neck)     Patient location during evaluation: PACU Anesthesia Type: General Level of consciousness: awake and alert Pain management: pain level controlled Vital Signs Assessment: post-procedure vital signs reviewed and stable Respiratory status: spontaneous breathing, nonlabored ventilation and respiratory function stable Cardiovascular status: blood pressure returned to baseline and stable Postop Assessment: no apparent nausea or vomiting Anesthetic complications: no   No complications documented.  Last Vitals:  Vitals:   10/08/19 1159 10/08/19 1214  BP: 133/70 139/83  Pulse: 67 73  Resp: 18 15  Temp:  36.7 C  SpO2: 98% 94%    Last Pain:  Vitals:   10/08/19 1159  PainSc: Landis Tinesha Siegrist

## 2019-10-08 NOTE — H&P (Signed)
Dorothy Baker. Dorothy Baker is an 55 y.o. female.   Chief Complaint: Thyroid nodule HPI: 54 year old female with right thyroid cystic nodule who presents for surgery.  Past Medical History:  Diagnosis Date  . Allergy   . Asthma   . Cystic thyroid nodule    right  . Diabetes mellitus   . Fibroids   . GERD (gastroesophageal reflux disease)   . Hypertension     Past Surgical History:  Procedure Laterality Date  . CESAREAN SECTION      Family History  Problem Relation Age of Onset  . Hyperlipidemia Mother   . Hypertension Mother   . Diabetes Mother   . Diabetes Father   . Allergic rhinitis Neg Hx   . Angioedema Neg Hx   . Asthma Neg Hx   . Eczema Neg Hx   . Immunodeficiency Neg Hx   . Urticaria Neg Hx    Social History:  reports that she has never smoked. She has never used smokeless tobacco. She reports current alcohol use. She reports that she does not use drugs.  Allergies:  Allergies  Allergen Reactions  . Aspirin Shortness Of Breath, Other (See Comments) and Cough    Asthma flair up Asthma attack   . Other Diarrhea, Nausea And Vomiting and Other (See Comments)    Peanut intolerance  . Ciprofloxacin Other (See Comments)    Muscle aches   . Quinolones Other (See Comments)    Questionable - muscle pain     Facility-Administered Medications Prior to Admission  Medication Dose Route Frequency Provider Last Rate Last Admin  . omalizumab Arvid Right) injection 375 mg  375 mg Subcutaneous Q14 Days Dorothy Quint, MD   375 mg at 10/07/19 1146   Medications Prior to Admission  Medication Sig Dispense Refill  . albuterol (VENTOLIN HFA) 108 (90 Base) MCG/ACT inhaler Inhale 2 puffs into the lungs every 6 (six) hours as needed for wheezing or shortness of breath. 18 g 1  . amitriptyline (ELAVIL) 25 MG tablet Take 25 mg by mouth at bedtime as needed for sleep.     Marland Kitchen amphetamine-dextroamphetamine (ADDERALL XR) 30 MG 24 hr capsule Take 30 mg by mouth daily.     . budesonide-formoterol  (SYMBICORT) 160-4.5 MCG/ACT inhaler Inhale 2 puffs into the lungs 2 (two) times daily. (Patient taking differently: Inhale 2 puffs into the lungs daily. ) 1 Inhaler 5  . Cholecalciferol (VITAMIN D3) 5000 units CAPS Take 5,000 Units by mouth daily.    . Lancets (ONETOUCH ULTRASOFT) lancets Check fasting glucose readings once daily.    Marland Kitchen levocetirizine (XYZAL) 5 MG tablet TAKE 1 TABLET BY MOUTH EVERY EVENING 90 tablet 3  . mometasone (NASONEX) 50 MCG/ACT nasal spray PLLACE 1 SPRAY INTO THE NOSE TWICE DAILY (Patient taking differently: Place 1 spray into the nose in the morning and at bedtime. ) 17 g 0  . montelukast (SINGULAIR) 10 MG tablet TAKE 1 TABLET BY MOUTH EVERY NIGHT AT BEDTIME 90 tablet 3  . Multiple Vitamins-Minerals (MULTIVITAMIN WITH MINERALS) tablet Take 1 tablet by mouth daily.     . Naltrexone-buPROPion HCl ER (CONTRAVE) 8-90 MG TB12 Take 1 tablet by mouth See admin instructions. Take 1 tablet by mouth twice daily, may increase to up to a max of 2 tablets by mouth twice daily as directed by doctor    . omeprazole (PRILOSEC) 20 MG capsule Take 20 mg by mouth daily.    Marland Kitchen Spacer/Aero-Holding Chambers (AEROCHAMBER PLUS) inhaler Use as directed with MDI  1 each 2  . valsartan-hydrochlorothiazide (DIOVAN-HCT) 160-12.5 MG tablet Take 1 tablet by mouth in the morning and at bedtime.     Arvid Right 150 MG injection Inject 375 mg dose into the skin every 14 (fourteen) days. (Patient taking differently: Inject 375 mg into the skin every 14 (fourteen) days. ) 6 each 10    Results for orders placed or performed during the hospital encounter of 10/08/19 (from the past 48 hour(s))  Glucose, capillary     Status: Abnormal   Collection Time: 10/08/19  7:03 AM  Result Value Ref Range   Glucose-Capillary 117 (H) 70 - 99 mg/dL    Comment: Glucose reference range applies only to samples taken after fasting for at least 8 hours.   No results found.  Review of Systems  All other systems reviewed and are  negative.   Blood pressure 112/72, pulse 82, temperature 98.5 F (36.9 C), resp. rate 18, height 5' 7.5" (1.715 m), weight 104.2 kg, SpO2 96 %. Physical Exam  HENT:  Head: Normocephalic and atraumatic.  Right Ear: External ear normal.  Left Ear: External ear normal.  Nose: Nose normal.  Mouth/Throat: Mucous membranes are moist. Oropharynx is clear.  Eyes: Pupils are equal, round, and reactive to light. Conjunctivae are normal.  Neck:  Right thyroid nodule  Cardiovascular: Normal rate.  Respiratory: Effort normal.  GI: Normal appearance.  Musculoskeletal:     Cervical back: Normal range of motion.  Neurological: She is alert.  Skin: Skin is warm and dry.  Psychiatric: Her behavior is normal. Mood, judgment and thought content normal.     Assessment/Plan Right thyroid cystic nodule  To OR for right thyroid lobectomy.  Melida Quitter, MD 10/08/2019, 9:31 AM

## 2019-10-08 NOTE — Progress Notes (Signed)
Patient ID: Dorothy Baker. Vieyra, female   DOB: 27-Nov-1965, 55 y.o.   MRN: [id number]  Postop check  She is lying supine in bed.  She is not moving very much.  She is breathing very clearly.  Her voice is very weak but I believe that is from lack of effort.  She has drank a little bit.  The neck looks excellent.  There is no swelling hematoma or infection.  Stable postop.  Continue mobilizing more.  Anticipate discharge in the morning.

## 2019-10-09 ENCOUNTER — Encounter (HOSPITAL_COMMUNITY): Payer: Self-pay | Admitting: Otolaryngology

## 2019-10-09 DIAGNOSIS — D34 Benign neoplasm of thyroid gland: Secondary | ICD-10-CM | POA: Diagnosis not present

## 2019-10-09 LAB — GLUCOSE, CAPILLARY: Glucose-Capillary: 137 mg/dL — ABNORMAL HIGH (ref 70–99)

## 2019-10-09 NOTE — Plan of Care (Signed)
Ready for discharge

## 2019-10-09 NOTE — Discharge Summary (Signed)
Physician Discharge Summary  Patient ID: Carinne Brandenburger. Schmelzer MRN: [id number] DOB/AGE: 05/26/1965 54 y.o.  Admit date: 10/08/2019 Discharge date: 10/09/2019  Admission Diagnoses: Thyroid nodule  Discharge Diagnoses:  Active Problems:   Thyroid nodule   Discharged Condition: good  Hospital Course: No complications  Consults: none  Significant Diagnostic Studies: none  Treatments: surgery: Thyroid lobectomy  Discharge Exam: Blood pressure 119/73, pulse 66, temperature 97.9 F (36.6 C), temperature source Oral, resp. rate 16, height 5' 7.5" (1.715 m), weight 104.2 kg, SpO2 97 %. PHYSICAL EXAM: Awake and alert, breathing clear.  Voice is healthy and strong today.  She is much more animated today.  Surgical site looks excellent.  No swelling hematoma or infection.  Disposition: Discharge disposition: 01-Home or Self Care       Discharge Instructions    Diet - low sodium heart healthy   Complete by: As directed    Diet - low sodium heart healthy   Complete by: As directed    Discharge instructions   Complete by: As directed    Avoid strenuous activity.  Regular diet as tolerated.  OK to allow incision to get wet, gently pat dry.  Do not apply ointment to the incision.   Increase activity slowly   Complete by: As directed    Increase activity slowly   Complete by: As directed    No wound care   Complete by: As directed      Allergies as of 10/09/2019      Reactions   Aspirin Shortness Of Breath, Other (See Comments), Cough   Asthma flair up Asthma attack   Other Diarrhea, Nausea And Vomiting, Other (See Comments)   Peanut intolerance   Ciprofloxacin Other (See Comments)   Muscle aches    Quinolones Other (See Comments)   Questionable - muscle pain       Medication List    TAKE these medications   AeroChamber Plus inhaler Use as directed with MDI   albuterol 108 (90 Base) MCG/ACT inhaler Commonly known as: Ventolin HFA Inhale 2 puffs into the lungs every 6  (six) hours as needed for wheezing or shortness of breath.   amitriptyline 25 MG tablet Commonly known as: ELAVIL Take 25 mg by mouth at bedtime as needed for sleep.   amphetamine-dextroamphetamine 30 MG 24 hr capsule Commonly known as: ADDERALL XR Take 30 mg by mouth daily.   budesonide-formoterol 160-4.5 MCG/ACT inhaler Commonly known as: SYMBICORT Inhale 2 puffs into the lungs 2 (two) times daily. What changed: when to take this   Contrave 8-90 MG Tb12 Generic drug: Naltrexone-buPROPion HCl ER Take 1 tablet by mouth See admin instructions. Take 1 tablet by mouth twice daily, may increase to up to a max of 2 tablets by mouth twice daily as directed by doctor   HYDROcodone-acetaminophen 5-325 MG tablet Commonly known as: NORCO/VICODIN Take 1-2 tablets by mouth every 6 (six) hours as needed for moderate pain.   levocetirizine 5 MG tablet Commonly known as: XYZAL TAKE 1 TABLET BY MOUTH EVERY EVENING   mometasone 50 MCG/ACT nasal spray Commonly known as: NASONEX PLLACE 1 SPRAY INTO THE NOSE TWICE DAILY What changed: See the new instructions.   montelukast 10 MG tablet Commonly known as: SINGULAIR TAKE 1 TABLET BY MOUTH EVERY NIGHT AT BEDTIME   multivitamin with minerals tablet Take 1 tablet by mouth daily.   omeprazole 20 MG capsule Commonly known as: PRILOSEC Take 20 mg by mouth daily.   onetouch ultrasoft lancets Check fasting glucose  readings once daily.   valsartan-hydrochlorothiazide 160-12.5 MG tablet Commonly known as: DIOVAN-HCT Take 1 tablet by mouth in the morning and at bedtime.   Vitamin D3 125 MCG (5000 UT) Caps Take 5,000 Units by mouth daily.   Xolair 150 MG injection Generic drug: omalizumab Inject 375 mg dose into the skin every 14 (fourteen) days. What changed: See the new instructions.       Follow-up Information    Melida Quitter, MD. Schedule an appointment as soon as possible for a visit in 1 week.   Specialty: Otolaryngology Contact  information: [address] [place] [address] [phone]               Signed: Izora Gala 10/09/2019, 8:35 AM

## 2019-10-11 LAB — SURGICAL PATHOLOGY

## 2019-10-21 ENCOUNTER — Other Ambulatory Visit: Payer: Self-pay

## 2019-10-21 ENCOUNTER — Ambulatory Visit (INDEPENDENT_AMBULATORY_CARE_PROVIDER_SITE_OTHER): Payer: Managed Care, Other (non HMO)

## 2019-10-21 DIAGNOSIS — J4540 Moderate persistent asthma, uncomplicated: Secondary | ICD-10-CM

## 2019-11-04 ENCOUNTER — Other Ambulatory Visit: Payer: Self-pay

## 2019-11-04 ENCOUNTER — Ambulatory Visit (INDEPENDENT_AMBULATORY_CARE_PROVIDER_SITE_OTHER): Payer: Managed Care, Other (non HMO)

## 2019-11-04 DIAGNOSIS — J4540 Moderate persistent asthma, uncomplicated: Secondary | ICD-10-CM

## 2019-11-18 ENCOUNTER — Other Ambulatory Visit: Payer: Self-pay

## 2019-11-18 ENCOUNTER — Ambulatory Visit (INDEPENDENT_AMBULATORY_CARE_PROVIDER_SITE_OTHER): Payer: Managed Care, Other (non HMO)

## 2019-11-18 DIAGNOSIS — J4540 Moderate persistent asthma, uncomplicated: Secondary | ICD-10-CM

## 2019-12-02 ENCOUNTER — Ambulatory Visit: Payer: Managed Care, Other (non HMO)

## 2019-12-03 ENCOUNTER — Ambulatory Visit (INDEPENDENT_AMBULATORY_CARE_PROVIDER_SITE_OTHER): Payer: Managed Care, Other (non HMO)

## 2019-12-03 ENCOUNTER — Other Ambulatory Visit: Payer: Self-pay

## 2019-12-03 DIAGNOSIS — J4540 Moderate persistent asthma, uncomplicated: Secondary | ICD-10-CM

## 2019-12-17 ENCOUNTER — Ambulatory Visit (INDEPENDENT_AMBULATORY_CARE_PROVIDER_SITE_OTHER): Payer: Managed Care, Other (non HMO)

## 2019-12-17 ENCOUNTER — Other Ambulatory Visit: Payer: Self-pay

## 2019-12-17 DIAGNOSIS — J4540 Moderate persistent asthma, uncomplicated: Secondary | ICD-10-CM

## 2019-12-30 ENCOUNTER — Ambulatory Visit (INDEPENDENT_AMBULATORY_CARE_PROVIDER_SITE_OTHER): Payer: Managed Care, Other (non HMO)

## 2019-12-30 DIAGNOSIS — J4540 Moderate persistent asthma, uncomplicated: Secondary | ICD-10-CM

## 2020-01-12 ENCOUNTER — Other Ambulatory Visit: Payer: Self-pay

## 2020-01-12 ENCOUNTER — Ambulatory Visit: Payer: Self-pay

## 2020-01-12 ENCOUNTER — Ambulatory Visit (INDEPENDENT_AMBULATORY_CARE_PROVIDER_SITE_OTHER): Payer: Managed Care, Other (non HMO)

## 2020-01-12 DIAGNOSIS — J4540 Moderate persistent asthma, uncomplicated: Secondary | ICD-10-CM

## 2020-01-21 ENCOUNTER — Other Ambulatory Visit: Payer: Self-pay | Admitting: Family Medicine

## 2020-01-21 NOTE — Telephone Encounter (Signed)
Called patient to inform her that she could get a cutesy refill but she would have to make an appointment future refills. Patient expressed that she would all of her appointments together going forward. Patient was able to make an appointment for the same day as her Xolair injection.

## 2020-01-26 NOTE — Patient Instructions (Addendum)
Moderate persistent asthma Continue montelukast 10 mg once a day to help prevent cough and wheeze Continue Symbicort 160/4.5 -2 puffs once a day to twice a day to help prevent cough and wheeze Continue Xolair 375 mg injections every 2 weeks Use albuterol 2 puffs every 4 hours as needed for cough, wheeze, tightness in chest, or shortness of breath.  Allergic rhinoconjunctivitis Continue Xyzal 5 mg once a day as needed for runny nose Continue Nasonex 2 sprays each nostril once a day as needed for stuffy nose May use saline nasal rinse or saline nasal spray as needed to help with nasal symptoms.  Use this prior to any medicated nasal sprays  Reflux Continue omeprazole 20 mg once a day Continue dietary and lifestyle modifications.  Let us know if these treatment plans or not working well for you Schedule follow-up appointment in 5 months

## 2020-01-27 ENCOUNTER — Ambulatory Visit (INDEPENDENT_AMBULATORY_CARE_PROVIDER_SITE_OTHER): Payer: Managed Care, Other (non HMO)

## 2020-01-27 ENCOUNTER — Ambulatory Visit: Payer: Managed Care, Other (non HMO) | Admitting: Family

## 2020-01-27 ENCOUNTER — Other Ambulatory Visit: Payer: Self-pay

## 2020-01-27 ENCOUNTER — Encounter: Payer: Self-pay | Admitting: Family

## 2020-01-27 VITALS — BP 118/90 | HR 100 | Temp 96.9°F | Resp 16

## 2020-01-27 DIAGNOSIS — J4540 Moderate persistent asthma, uncomplicated: Secondary | ICD-10-CM

## 2020-01-27 DIAGNOSIS — K219 Gastro-esophageal reflux disease without esophagitis: Secondary | ICD-10-CM

## 2020-01-27 DIAGNOSIS — J309 Allergic rhinitis, unspecified: Secondary | ICD-10-CM | POA: Diagnosis not present

## 2020-01-27 DIAGNOSIS — H1010 Acute atopic conjunctivitis, unspecified eye: Secondary | ICD-10-CM | POA: Diagnosis not present

## 2020-01-27 NOTE — Progress Notes (Signed)
[place] [address] Dept: [phone]  FOLLOW UP NOTE  Patient ID: Dorothy Baker, female    DOB: 02-09-1966  Age: 54 y.o. MRN: [id number] Date of Office Visit: 01/27/2020  Assessment  Chief Complaint: Asthma  HPI Dorothy Baker is a 54 year old female who presents today for follow-up of moderate persistent asthma, allergic rhinoconjunctivitis, and gastroesophageal reflux disease.  She was last seen on July 21, 2019 by Gareth Morgan, FNP.  Moderate persistent asthma is reported as controlled with Symbicort 160/4.5 mcg 2 puffs twice a day with spacer, montelukast 10 mg once a day, Xolair injections 375 mg every 2 weeks, and albuterol as needed.  She denies any coughing, wheezing, tightness in chest, shortness of breath, and nocturnal awakenings.  Since her last office visit she has not required any systemic steroids or made any trips to the emergency room or urgent care due to breathing problems.  She is using her albuterol inhaler maybe 2 times a week.  She does feel that her Xolair injections have improved her asthma symptoms.  Allergic rhinitis is reported as controlled with Xyzal 5 mg once a day and Nasonex nasal spray as needed.  She denies any rhinorrhea, nasal congestion, postnasal drip, and itchy watery eyes.  Reflux is reported as controlled with omeprazole 20 mg once a day.  She denies any symptoms of reflux or heartburn.  She reports that she had bilateral PE tubes placed this past April due to fluid  behind her ears.  She did mention that approximately 2 days ago she used a Q-tip to clean her left ear and may have gone too deep.  She noticed some bleeding.  She used some leftover antibiotic cream that she had.  She denies any pain in her ears or fever and chills.  Current medications are as listed in the chart.   Drug Allergies:  Allergies  Allergen Reactions  . Aspirin Shortness Of Breath, Other (See Comments) and Cough    Asthma flair  up Asthma attack   . Other Diarrhea, Nausea And Vomiting and Other (See Comments)    Peanut intolerance  . Ciprofloxacin Other (See Comments)    Muscle aches   . Quinolones Other (See Comments)    Questionable - muscle pain   . Soy Allergy     Causes a lot of mucus    Review of Systems: Review of Systems  Constitutional: Negative for chills and fever.  HENT:       Denies rhinorrhea, post nasal drip, and nasal congestion  Eyes:       Denies itchy watery eyes  Respiratory: Negative for cough, shortness of breath and wheezing.   Cardiovascular: Negative for chest pain and palpitations.  Gastrointestinal: Negative for abdominal pain and heartburn.  Genitourinary: Negative for dysuria.  Skin: Negative for itching and rash.  Neurological: Negative for headaches.  Endo/Heme/Allergies: Positive for environmental allergies.    Physical Exam: BP 120/90   Pulse (!) 110   Temp (!) 96.9 F (36.1 C) (Temporal)   Resp 16   SpO2 95%    Physical Exam Constitutional:      Appearance: Normal appearance.  HENT:     Head: Normocephalic and atraumatic.     Right Ear: Tympanic membrane, ear canal and external ear normal.     Left Ear: Tympanic membrane, ear canal and external ear normal.     Ears:     Comments: Bilateral PE tubes present. Dried blood noted around left ear PE  tube    Nose: Nose normal.     Mouth/Throat:     Mouth: Mucous membranes are moist.     Pharynx: Oropharynx is clear.  Eyes:     Conjunctiva/sclera: Conjunctivae normal.  Cardiovascular:     Rate and Rhythm: Regular rhythm.     Heart sounds: Normal heart sounds.  Pulmonary:     Effort: Pulmonary effort is normal.     Breath sounds: Normal breath sounds.     Comments: Lungs clear to auscultation Musculoskeletal:     Cervical back: Neck supple.  Skin:    General: Skin is warm.  Neurological:     Mental Status: She is alert and oriented to person, place, and time.  Psychiatric:        Mood and Affect:  Mood normal.        Behavior: Behavior normal.        Thought Content: Thought content normal.        Judgment: Judgment normal.     Diagnostics: FVC 2.65 L, FEV1 1.79 L.  Predicted FVC 3.12 L, FEV1 2.48 L.  Spirometry indicates mild airway obstruction.  Spirometry is consistent with previous spirometry's.  Assessment and Plan: 1. Moderate persistent asthma without complication   2. Allergic rhinoconjunctivitis   3. Gastroesophageal reflux disease, unspecified whether esophagitis present     No orders of the defined types were placed in this encounter.   Patient Instructions  Moderate persistent asthma Continue montelukast 10 mg once a day to help prevent cough and wheeze Continue Symbicort 160/4.5 -2 puffs once a day to twice a day to help prevent cough and wheeze Continue Xolair 375 mg injections every 2 weeks Use albuterol 2 puffs every 4 hours as needed for cough, wheeze, tightness in chest, or shortness of breath.  Allergic rhinoconjunctivitis Continue Xyzal 5 mg once a day as needed for runny nose Continue Nasonex 2 sprays each nostril once a day as needed for stuffy nose May use saline nasal rinse or saline nasal spray as needed to help with nasal symptoms.  Use this prior to any medicated nasal sprays  Reflux Continue omeprazole 20 mg once a day Continue dietary and lifestyle modifications.  Let us know if these treatment plans or not working well for you Schedule follow-up appointment in 5 months   Return in about 5 months (around 06/26/2020), or if symptoms worsen or fail to improve.    Thank you for the opportunity to care for this patient.  Please do not hesitate to contact me with questions.  Althea Charon, FNP [facility]

## 2020-01-31 ENCOUNTER — Other Ambulatory Visit: Payer: Self-pay | Admitting: Allergy

## 2020-02-09 ENCOUNTER — Ambulatory Visit (INDEPENDENT_AMBULATORY_CARE_PROVIDER_SITE_OTHER): Payer: Managed Care, Other (non HMO)

## 2020-02-09 ENCOUNTER — Other Ambulatory Visit: Payer: Self-pay

## 2020-02-09 DIAGNOSIS — J4540 Moderate persistent asthma, uncomplicated: Secondary | ICD-10-CM

## 2020-02-23 ENCOUNTER — Telehealth: Payer: Self-pay

## 2020-02-23 ENCOUNTER — Other Ambulatory Visit: Payer: Self-pay

## 2020-02-23 ENCOUNTER — Ambulatory Visit (INDEPENDENT_AMBULATORY_CARE_PROVIDER_SITE_OTHER): Payer: Managed Care, Other (non HMO)

## 2020-02-23 ENCOUNTER — Ambulatory Visit: Payer: Managed Care, Other (non HMO)

## 2020-02-23 DIAGNOSIS — J4540 Moderate persistent asthma, uncomplicated: Secondary | ICD-10-CM | POA: Diagnosis not present

## 2020-02-23 NOTE — Telephone Encounter (Signed)
Opened by accident

## 2020-03-08 ENCOUNTER — Ambulatory Visit: Payer: Self-pay

## 2020-03-08 ENCOUNTER — Other Ambulatory Visit: Payer: Self-pay | Admitting: *Deleted

## 2020-03-08 DIAGNOSIS — Z1231 Encounter for screening mammogram for malignant neoplasm of breast: Secondary | ICD-10-CM

## 2020-03-09 ENCOUNTER — Ambulatory Visit (INDEPENDENT_AMBULATORY_CARE_PROVIDER_SITE_OTHER): Payer: Managed Care, Other (non HMO)

## 2020-03-09 ENCOUNTER — Other Ambulatory Visit: Payer: Self-pay

## 2020-03-09 DIAGNOSIS — J4540 Moderate persistent asthma, uncomplicated: Secondary | ICD-10-CM

## 2020-03-23 ENCOUNTER — Other Ambulatory Visit: Payer: Self-pay

## 2020-03-23 ENCOUNTER — Ambulatory Visit (INDEPENDENT_AMBULATORY_CARE_PROVIDER_SITE_OTHER): Payer: Managed Care, Other (non HMO) | Admitting: *Deleted

## 2020-03-23 DIAGNOSIS — J4540 Moderate persistent asthma, uncomplicated: Secondary | ICD-10-CM

## 2020-04-06 ENCOUNTER — Ambulatory Visit (INDEPENDENT_AMBULATORY_CARE_PROVIDER_SITE_OTHER): Payer: Managed Care, Other (non HMO)

## 2020-04-06 ENCOUNTER — Other Ambulatory Visit: Payer: Self-pay

## 2020-04-06 DIAGNOSIS — J4540 Moderate persistent asthma, uncomplicated: Secondary | ICD-10-CM | POA: Diagnosis not present

## 2020-04-19 ENCOUNTER — Ambulatory Visit (INDEPENDENT_AMBULATORY_CARE_PROVIDER_SITE_OTHER): Payer: Managed Care, Other (non HMO)

## 2020-04-19 DIAGNOSIS — J4540 Moderate persistent asthma, uncomplicated: Secondary | ICD-10-CM | POA: Diagnosis not present

## 2020-04-20 ENCOUNTER — Ambulatory Visit: Payer: Self-pay

## 2020-05-04 ENCOUNTER — Ambulatory Visit (INDEPENDENT_AMBULATORY_CARE_PROVIDER_SITE_OTHER): Payer: Managed Care, Other (non HMO)

## 2020-05-04 DIAGNOSIS — J4540 Moderate persistent asthma, uncomplicated: Secondary | ICD-10-CM | POA: Diagnosis not present

## 2020-05-18 ENCOUNTER — Ambulatory Visit (INDEPENDENT_AMBULATORY_CARE_PROVIDER_SITE_OTHER): Payer: Managed Care, Other (non HMO)

## 2020-05-18 ENCOUNTER — Other Ambulatory Visit: Payer: Self-pay

## 2020-05-18 DIAGNOSIS — J4540 Moderate persistent asthma, uncomplicated: Secondary | ICD-10-CM

## 2020-05-31 ENCOUNTER — Ambulatory Visit: Payer: Self-pay

## 2020-06-01 ENCOUNTER — Other Ambulatory Visit: Payer: Self-pay

## 2020-06-01 ENCOUNTER — Ambulatory Visit (INDEPENDENT_AMBULATORY_CARE_PROVIDER_SITE_OTHER): Payer: Managed Care, Other (non HMO)

## 2020-06-01 DIAGNOSIS — J4540 Moderate persistent asthma, uncomplicated: Secondary | ICD-10-CM

## 2020-06-15 ENCOUNTER — Ambulatory Visit (INDEPENDENT_AMBULATORY_CARE_PROVIDER_SITE_OTHER): Payer: Managed Care, Other (non HMO)

## 2020-06-15 ENCOUNTER — Other Ambulatory Visit: Payer: Self-pay

## 2020-06-15 DIAGNOSIS — J4540 Moderate persistent asthma, uncomplicated: Secondary | ICD-10-CM | POA: Diagnosis not present

## 2020-06-20 ENCOUNTER — Other Ambulatory Visit: Payer: Self-pay | Admitting: Allergy

## 2020-06-20 NOTE — Telephone Encounter (Signed)
Please advise 

## 2020-06-25 ENCOUNTER — Other Ambulatory Visit: Payer: Self-pay | Admitting: Allergy

## 2020-06-29 ENCOUNTER — Ambulatory Visit (INDEPENDENT_AMBULATORY_CARE_PROVIDER_SITE_OTHER): Payer: Managed Care, Other (non HMO)

## 2020-06-29 ENCOUNTER — Other Ambulatory Visit: Payer: Self-pay

## 2020-06-29 DIAGNOSIS — J4540 Moderate persistent asthma, uncomplicated: Secondary | ICD-10-CM | POA: Diagnosis not present

## 2020-07-04 HISTORY — PX: TYMPANOSTOMY TUBE PLACEMENT: SHX32

## 2020-07-17 ENCOUNTER — Other Ambulatory Visit: Payer: Self-pay

## 2020-07-17 ENCOUNTER — Ambulatory Visit (INDEPENDENT_AMBULATORY_CARE_PROVIDER_SITE_OTHER): Payer: Managed Care, Other (non HMO)

## 2020-07-17 DIAGNOSIS — J4540 Moderate persistent asthma, uncomplicated: Secondary | ICD-10-CM | POA: Diagnosis not present

## 2020-07-29 ENCOUNTER — Other Ambulatory Visit: Payer: Self-pay | Admitting: Allergy

## 2020-07-31 ENCOUNTER — Telehealth: Payer: Self-pay

## 2020-07-31 ENCOUNTER — Other Ambulatory Visit: Payer: Self-pay | Admitting: Allergy

## 2020-07-31 NOTE — Telephone Encounter (Signed)
Patient requested a refill on Montelukast.  Patient was informed she needed an office visit.  Patient was transferred to the front desk to schedule an appointment.  Montelukast was sent in to [facility] in [place], [place].

## 2020-08-03 ENCOUNTER — Ambulatory Visit (INDEPENDENT_AMBULATORY_CARE_PROVIDER_SITE_OTHER): Payer: Managed Care, Other (non HMO)

## 2020-08-03 ENCOUNTER — Other Ambulatory Visit: Payer: Self-pay

## 2020-08-03 DIAGNOSIS — J4540 Moderate persistent asthma, uncomplicated: Secondary | ICD-10-CM | POA: Diagnosis not present

## 2020-08-17 ENCOUNTER — Other Ambulatory Visit: Payer: Self-pay

## 2020-08-17 ENCOUNTER — Ambulatory Visit (INDEPENDENT_AMBULATORY_CARE_PROVIDER_SITE_OTHER): Payer: Managed Care, Other (non HMO)

## 2020-08-17 DIAGNOSIS — J4540 Moderate persistent asthma, uncomplicated: Secondary | ICD-10-CM

## 2020-08-31 ENCOUNTER — Ambulatory Visit (INDEPENDENT_AMBULATORY_CARE_PROVIDER_SITE_OTHER): Payer: Managed Care, Other (non HMO)

## 2020-08-31 ENCOUNTER — Other Ambulatory Visit: Payer: Self-pay

## 2020-08-31 ENCOUNTER — Ambulatory Visit: Payer: Self-pay

## 2020-08-31 DIAGNOSIS — J4540 Moderate persistent asthma, uncomplicated: Secondary | ICD-10-CM

## 2020-09-01 ENCOUNTER — Other Ambulatory Visit: Payer: Self-pay | Admitting: Allergy

## 2020-09-04 ENCOUNTER — Other Ambulatory Visit: Payer: Self-pay | Admitting: Family Medicine

## 2020-09-04 ENCOUNTER — Other Ambulatory Visit: Payer: Self-pay

## 2020-09-04 ENCOUNTER — Ambulatory Visit (INDEPENDENT_AMBULATORY_CARE_PROVIDER_SITE_OTHER): Payer: Managed Care, Other (non HMO) | Admitting: Family Medicine

## 2020-09-04 ENCOUNTER — Encounter: Payer: Self-pay | Admitting: Family Medicine

## 2020-09-04 VITALS — BP 98/60 | HR 103 | Temp 97.5°F | Resp 16

## 2020-09-04 DIAGNOSIS — H1013 Acute atopic conjunctivitis, bilateral: Secondary | ICD-10-CM

## 2020-09-04 DIAGNOSIS — K219 Gastro-esophageal reflux disease without esophagitis: Secondary | ICD-10-CM

## 2020-09-04 DIAGNOSIS — J4540 Moderate persistent asthma, uncomplicated: Secondary | ICD-10-CM

## 2020-09-04 DIAGNOSIS — J309 Allergic rhinitis, unspecified: Secondary | ICD-10-CM | POA: Diagnosis not present

## 2020-09-04 DIAGNOSIS — H1010 Acute atopic conjunctivitis, unspecified eye: Secondary | ICD-10-CM

## 2020-09-04 MED ORDER — MOMETASONE FUROATE 50 MCG/ACT NA SUSP: NASAL | 11 refills | Status: DC

## 2020-09-04 MED ORDER — OMEPRAZOLE 20 MG PO CPDR: 20.0000 mg | DELAYED_RELEASE_CAPSULE | Freq: Every day | ORAL | 3 refills | Status: DC

## 2020-09-04 MED ORDER — ALBUTEROL SULFATE HFA 108 (90 BASE) MCG/ACT IN AERS: 2.0000 | INHALATION_SPRAY | Freq: Four times a day (QID) | RESPIRATORY_TRACT | 1 refills | Status: DC | PRN

## 2020-09-04 MED ORDER — SYMBICORT 160-4.5 MCG/ACT IN AERO: 2.0000 | INHALATION_SPRAY | Freq: Two times a day (BID) | RESPIRATORY_TRACT | 1 refills | Status: DC

## 2020-09-04 MED ORDER — LEVOCETIRIZINE DIHYDROCHLORIDE 5 MG PO TABS: 5.0000 mg | ORAL_TABLET | Freq: Every evening | ORAL | 3 refills | Status: DC

## 2020-09-04 MED ORDER — EPINEPHRINE 0.3 MG/0.3ML IJ SOAJ: 0.3000 mg | Freq: Once | INTRAMUSCULAR | 1 refills | Status: AC

## 2020-09-04 MED ORDER — MONTELUKAST SODIUM 10 MG PO TABS: 1.0000 | ORAL_TABLET | Freq: Every day | ORAL | 1 refills | Status: DC

## 2020-09-04 NOTE — Progress Notes (Signed)
[place] [id number] Dept: [phone]  FOLLOW UP NOTE  Patient ID: Dorothy Baker, female    DOB: Oct 01, 1965  Age: 55 y.o. MRN: [id number] Date of Office Visit: 09/04/2020  Assessment  Chief Complaint: Asthma  HPI Dorothy Baker is a 55 year old female who presents to the clinic for follow-up visit.  She was last seen in this clinic on 01/27/2020 by Althea Charon, FNP, for evaluation of asthma, allergic rhinitis, and reflux.  At today's visit she reports her asthma has been well controlled with no shortness of breath or wheeze with activity or rest.  She does report an occasional dry cough occurring in the heat or while cleaning.  She continues montelukast 10 mg once a day, however, she is currently out of this medication.  She continues Symbicort 160-2 puffs twice a day and has not needed to use albuterol since her last visit to this clinic.  She reports Xolair injections have been going well with no local reactions.  She reports a significant decrease in the symptoms of her asthma while continuing on Xolair injections.  Allergic rhinitis is reported as well controlled with no symptoms.  She continues Xyzal 5 mg once a day, Flonase daily, and saline nasal rinses twice a day.  Reflux is reported as well controlled with omeprazole 20 mg once a day.  Her current medications are listed in the chart.   Drug Allergies:  Allergies  Allergen Reactions  . Aspirin Shortness Of Breath, Other (See Comments) and Cough    Asthma flair up Asthma attack   . Other Diarrhea, Nausea And Vomiting and Other (See Comments)    Peanut intolerance  . Ciprofloxacin Other (See Comments)    Muscle aches   . Quinolones Other (See Comments)    Questionable - muscle pain   . Soy Allergy     Causes a lot of mucus    Physical Exam: BP 98/60   Pulse (!) 103   Temp (!) 97.5 F (36.4 C) (Temporal)   Resp 16   SpO2 97%    Physical Exam Vitals reviewed.   Constitutional:      Appearance: Normal appearance.  HENT:     Head: Normocephalic and atraumatic.     Right Ear: Tympanic membrane normal.     Left Ear: Tympanic membrane normal.     Nose:     Comments: Bilateral nares normal.  Pharynx normal.  Ears normal.  Bilateral tympanostomy tubes remain in place.  Eyes normal.    Mouth/Throat:     Pharynx: Oropharynx is clear.  Eyes:     Conjunctiva/sclera: Conjunctivae normal.  Cardiovascular:     Rate and Rhythm: Normal rate and regular rhythm.     Heart sounds: Normal heart sounds. No murmur heard.   Pulmonary:     Effort: Pulmonary effort is normal.     Breath sounds: Normal breath sounds.     Comments: Lungs clear to auscultation Musculoskeletal:        General: Normal range of motion.     Cervical back: Normal range of motion and neck supple.  Skin:    General: Skin is warm and dry.  Neurological:     Mental Status: She is alert and oriented to person, place, and time.  Psychiatric:        Mood and Affect: Mood normal.        Behavior: Behavior normal.        Thought Content: Thought content normal.  Judgment: Judgment normal.     Diagnostics: FVC 2.43, FEV1 1.58.  Predicted FVC 3.11, predicted FEV1 2.47.  Spirometry indicates moderate obstruction.  This is consistent with previous spirometry readings.  Assessment and Plan: 1. Moderate persistent asthma without complication   2. Allergic rhinoconjunctivitis   3. Gastroesophageal reflux disease, unspecified whether esophagitis present     Meds ordered this encounter  Medications  . albuterol (VENTOLIN HFA) 108 (90 Base) MCG/ACT inhaler    Sig: Inhale 2 puffs into the lungs every 6 (six) hours as needed for wheezing or shortness of breath.    Dispense:  18 g    Refill:  1  . levocetirizine (XYZAL) 5 MG tablet    Sig: Take 1 tablet (5 mg total) by mouth every evening.    Dispense:  90 tablet    Refill:  3    90 day supply  . mometasone (NASONEX) 50 MCG/ACT  nasal spray    Sig: SHAKE LIQUID AND USE 1 SPRAY IN EACH NOSTRIL TWICE DAILY    Dispense:  17 g    Refill:  11  . montelukast (SINGULAIR) 10 MG tablet    Sig: Take 1 tablet (10 mg total) by mouth at bedtime.    Dispense:  90 tablet    Refill:  1    **Patient requests 90 days supply**  . omeprazole (PRILOSEC) 20 MG capsule    Sig: Take 1 capsule (20 mg total) by mouth daily.    Dispense:  90 capsule    Refill:  3  . SYMBICORT 160-4.5 MCG/ACT inhaler    Sig: Inhale 2 puffs into the lungs 2 (two) times daily.    Dispense:  30.6 g    Refill:  1    May dispense 90 day supply  . EPINEPHrine 0.3 mg/0.3 mL IJ SOAJ injection    Sig: Inject 0.3 mg into the muscle once for 1 dose.    Dispense:  2 each    Refill:  1    dispense brand or generic mylan only    Patient Instructions  Moderate persistent asthma Continue montelukast 10 mg once a day to help prevent cough and wheeze Continue Symbicort 160/4.5 -2 puffs once a day to twice a day with a spacer to prevent cough and wheeze Continue Xolair 375 mg injections every 2 weeks and have access to an epinephrine auto-injector set Use albuterol 2 puffs every 4 hours as needed for cough or wheeze  Allergic rhinoconjunctivitis Continue Xyzal 5 mg once a day as needed for runny nose Continue Nasonex 2 sprays each nostril once a day as needed for stuffy nose May use saline nasal rinse or saline nasal spray as needed to help with nasal symptoms.  Use this prior to any medicated nasal sprays  Reflux Continue omeprazole 20 mg once a day Continue dietary and lifestyle modifications.  Call the clinic if this treatment plan is not working well for you  Follow up in 6 months or sooner if needed.    Return in about 6 months (around 03/07/2021), or if symptoms worsen or fail to improve.    Thank you for the opportunity to care for this patient.  Please do not hesitate to contact me with questions.  Gareth Morgan, FNP [facility]

## 2020-09-04 NOTE — Patient Instructions (Addendum)
Moderate persistent asthma Continue montelukast 10 mg once a day to help prevent cough and wheeze Continue Symbicort 160/4.5 -2 puffs once a day to twice a day with a spacer to prevent cough and wheeze Continue Xolair 375 mg injections every 2 weeks and have access to an epinephrine auto-injector set Use albuterol 2 puffs every 4 hours as needed for cough or wheeze  Allergic rhinoconjunctivitis Continue Xyzal 5 mg once a day as needed for runny nose Continue Nasonex 2 sprays each nostril once a day as needed for stuffy nose May use saline nasal rinse or saline nasal spray as needed to help with nasal symptoms.  Use this prior to any medicated nasal sprays  Reflux Continue omeprazole 20 mg once a day Continue dietary and lifestyle modifications.  Call the clinic if this treatment plan is not working well for you  Follow up in 6 months or sooner if needed.

## 2020-09-14 ENCOUNTER — Ambulatory Visit: Payer: Managed Care, Other (non HMO)

## 2020-09-14 ENCOUNTER — Other Ambulatory Visit: Payer: Self-pay

## 2020-09-14 ENCOUNTER — Ambulatory Visit (INDEPENDENT_AMBULATORY_CARE_PROVIDER_SITE_OTHER): Payer: Managed Care, Other (non HMO)

## 2020-09-14 DIAGNOSIS — J4540 Moderate persistent asthma, uncomplicated: Secondary | ICD-10-CM

## 2020-09-28 ENCOUNTER — Other Ambulatory Visit: Payer: Self-pay

## 2020-09-28 ENCOUNTER — Ambulatory Visit (INDEPENDENT_AMBULATORY_CARE_PROVIDER_SITE_OTHER): Payer: Managed Care, Other (non HMO)

## 2020-09-28 DIAGNOSIS — J4540 Moderate persistent asthma, uncomplicated: Secondary | ICD-10-CM

## 2020-10-12 ENCOUNTER — Ambulatory Visit: Payer: Self-pay

## 2020-10-18 ENCOUNTER — Ambulatory Visit (INDEPENDENT_AMBULATORY_CARE_PROVIDER_SITE_OTHER): Payer: Managed Care, Other (non HMO)

## 2020-10-18 ENCOUNTER — Other Ambulatory Visit: Payer: Self-pay

## 2020-10-18 DIAGNOSIS — J4540 Moderate persistent asthma, uncomplicated: Secondary | ICD-10-CM | POA: Diagnosis not present

## 2020-11-01 ENCOUNTER — Ambulatory Visit: Payer: Managed Care, Other (non HMO)

## 2020-11-02 ENCOUNTER — Other Ambulatory Visit: Payer: Self-pay

## 2020-11-02 ENCOUNTER — Ambulatory Visit (INDEPENDENT_AMBULATORY_CARE_PROVIDER_SITE_OTHER): Payer: Managed Care, Other (non HMO)

## 2020-11-02 DIAGNOSIS — J4540 Moderate persistent asthma, uncomplicated: Secondary | ICD-10-CM

## 2020-11-16 ENCOUNTER — Other Ambulatory Visit: Payer: Self-pay

## 2020-11-16 ENCOUNTER — Ambulatory Visit (INDEPENDENT_AMBULATORY_CARE_PROVIDER_SITE_OTHER): Payer: Managed Care, Other (non HMO)

## 2020-11-16 DIAGNOSIS — J4540 Moderate persistent asthma, uncomplicated: Secondary | ICD-10-CM | POA: Diagnosis not present

## 2020-11-30 ENCOUNTER — Ambulatory Visit (INDEPENDENT_AMBULATORY_CARE_PROVIDER_SITE_OTHER): Payer: Managed Care, Other (non HMO)

## 2020-11-30 ENCOUNTER — Other Ambulatory Visit: Payer: Self-pay

## 2020-11-30 DIAGNOSIS — J4540 Moderate persistent asthma, uncomplicated: Secondary | ICD-10-CM

## 2020-12-13 ENCOUNTER — Other Ambulatory Visit: Payer: Self-pay

## 2020-12-13 ENCOUNTER — Ambulatory Visit (INDEPENDENT_AMBULATORY_CARE_PROVIDER_SITE_OTHER): Payer: Managed Care, Other (non HMO)

## 2020-12-13 ENCOUNTER — Ambulatory Visit: Payer: Managed Care, Other (non HMO)

## 2020-12-13 DIAGNOSIS — J4540 Moderate persistent asthma, uncomplicated: Secondary | ICD-10-CM | POA: Diagnosis not present

## 2020-12-28 ENCOUNTER — Ambulatory Visit (INDEPENDENT_AMBULATORY_CARE_PROVIDER_SITE_OTHER): Payer: Managed Care, Other (non HMO)

## 2020-12-28 ENCOUNTER — Other Ambulatory Visit: Payer: Self-pay

## 2020-12-28 DIAGNOSIS — J4540 Moderate persistent asthma, uncomplicated: Secondary | ICD-10-CM

## 2021-01-11 ENCOUNTER — Ambulatory Visit: Payer: Managed Care, Other (non HMO)

## 2021-01-12 ENCOUNTER — Ambulatory Visit (INDEPENDENT_AMBULATORY_CARE_PROVIDER_SITE_OTHER): Payer: Managed Care, Other (non HMO)

## 2021-01-12 ENCOUNTER — Other Ambulatory Visit: Payer: Self-pay

## 2021-01-12 DIAGNOSIS — J4540 Moderate persistent asthma, uncomplicated: Secondary | ICD-10-CM | POA: Diagnosis not present

## 2021-01-25 ENCOUNTER — Other Ambulatory Visit: Payer: Self-pay

## 2021-01-25 ENCOUNTER — Ambulatory Visit (INDEPENDENT_AMBULATORY_CARE_PROVIDER_SITE_OTHER): Payer: Managed Care, Other (non HMO)

## 2021-01-25 DIAGNOSIS — J4540 Moderate persistent asthma, uncomplicated: Secondary | ICD-10-CM

## 2021-02-08 ENCOUNTER — Ambulatory Visit: Payer: Managed Care, Other (non HMO)

## 2021-02-13 ENCOUNTER — Ambulatory Visit: Payer: Managed Care, Other (non HMO)

## 2021-02-15 ENCOUNTER — Other Ambulatory Visit: Payer: Self-pay

## 2021-02-15 ENCOUNTER — Ambulatory Visit (INDEPENDENT_AMBULATORY_CARE_PROVIDER_SITE_OTHER): Payer: Managed Care, Other (non HMO)

## 2021-02-15 DIAGNOSIS — J4540 Moderate persistent asthma, uncomplicated: Secondary | ICD-10-CM

## 2021-02-19 ENCOUNTER — Ambulatory Visit: Payer: Managed Care, Other (non HMO)

## 2021-03-01 ENCOUNTER — Ambulatory Visit: Payer: Managed Care, Other (non HMO)

## 2021-03-05 ENCOUNTER — Ambulatory Visit (INDEPENDENT_AMBULATORY_CARE_PROVIDER_SITE_OTHER): Payer: Managed Care, Other (non HMO) | Admitting: *Deleted

## 2021-03-05 DIAGNOSIS — J4540 Moderate persistent asthma, uncomplicated: Secondary | ICD-10-CM | POA: Diagnosis not present

## 2021-03-08 IMAGING — MG DIGITAL SCREENING BILAT W/ TOMO
6 of 12 series · 6 of 36 positions shown · non-contrast
Comparison: Previous exam(s).

ACR Breast Density Category a: The breast tissue is almost entirely
fatty.

CLINICAL DATA: Screening.

EXAM:
DIGITAL SCREENING BILATERAL MAMMOGRAM WITH TOMO AND CAD

[L CC synth-2D (1 of 2)]
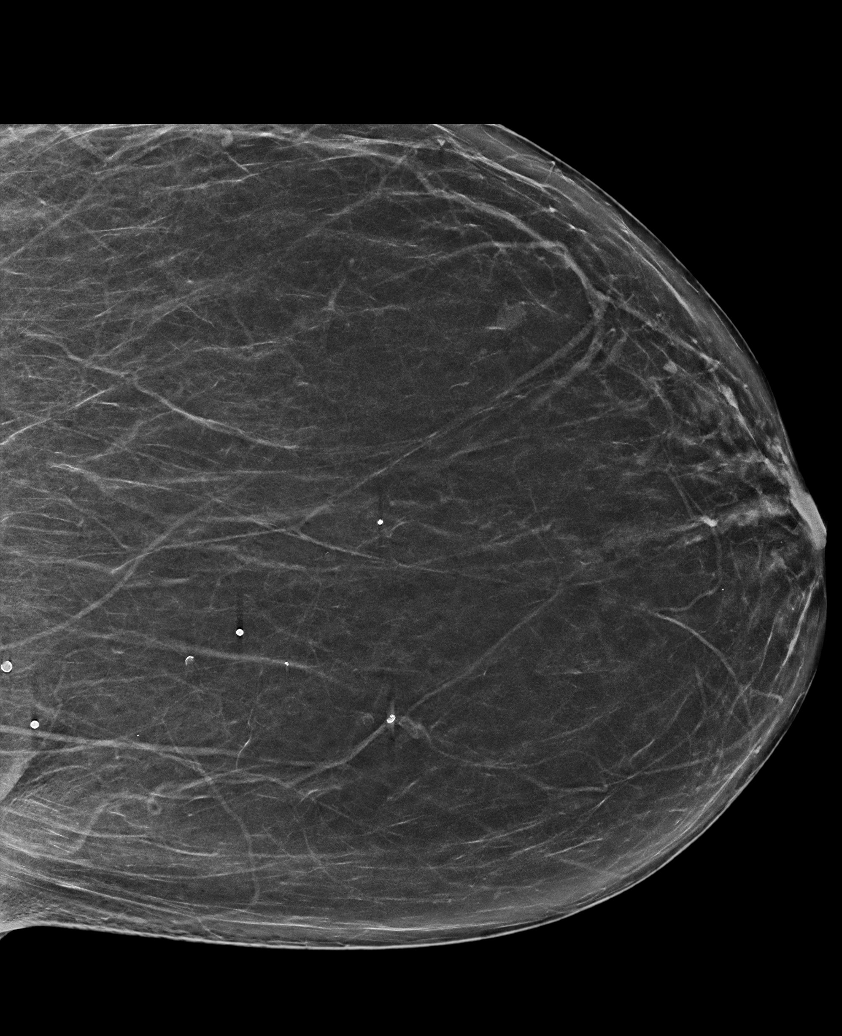

[R MLO synth-2D]
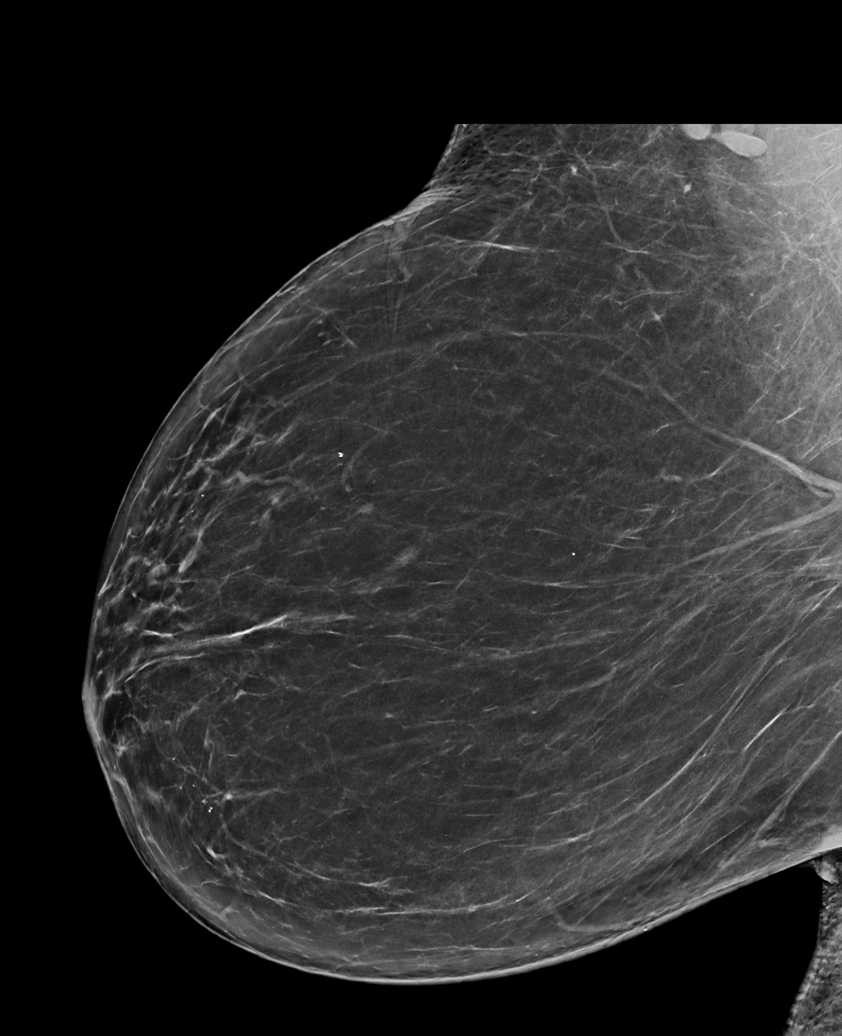

[R CC synth-2D (1 of 2)]
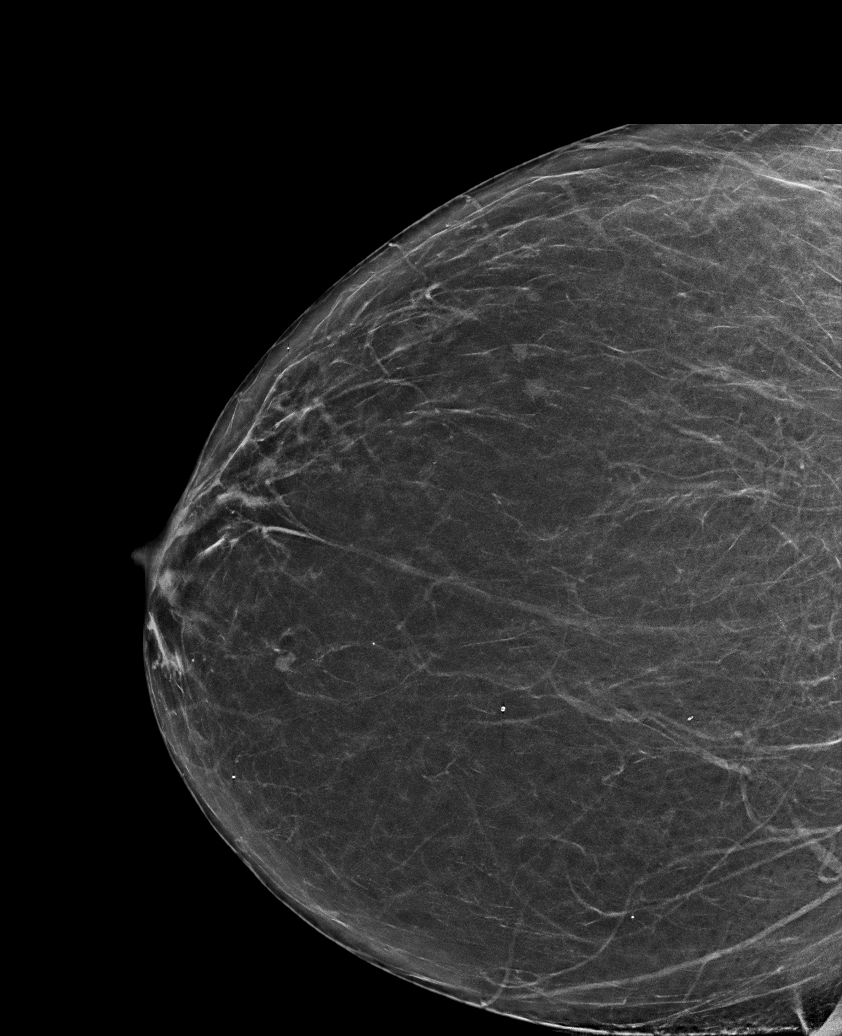

[L CC synth-2D (2 of 2)]
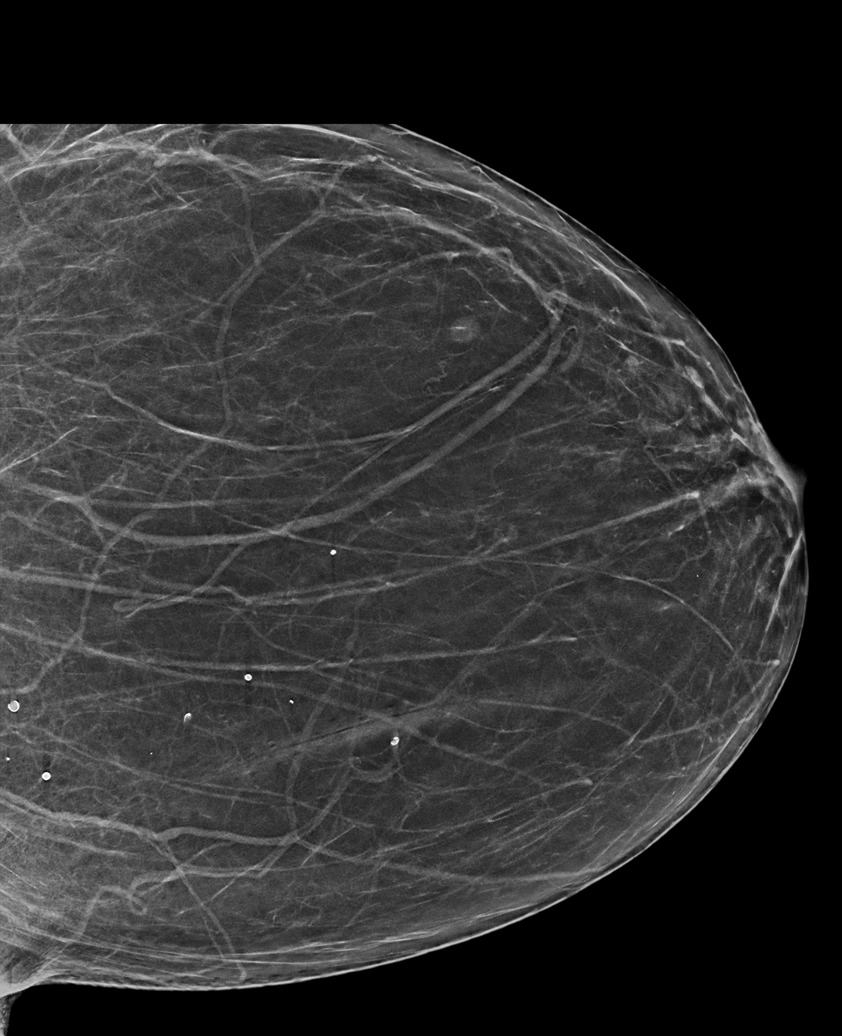

[L MLO synth-2D]
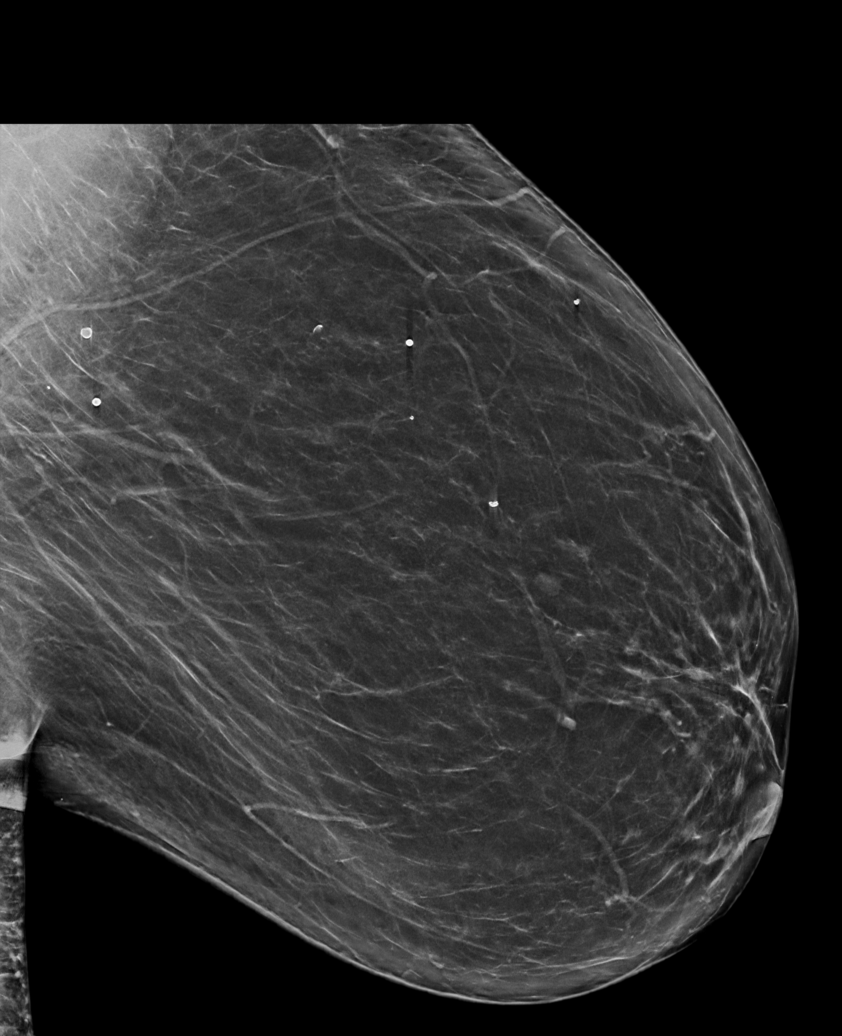

[R CC synth-2D (2 of 2)]
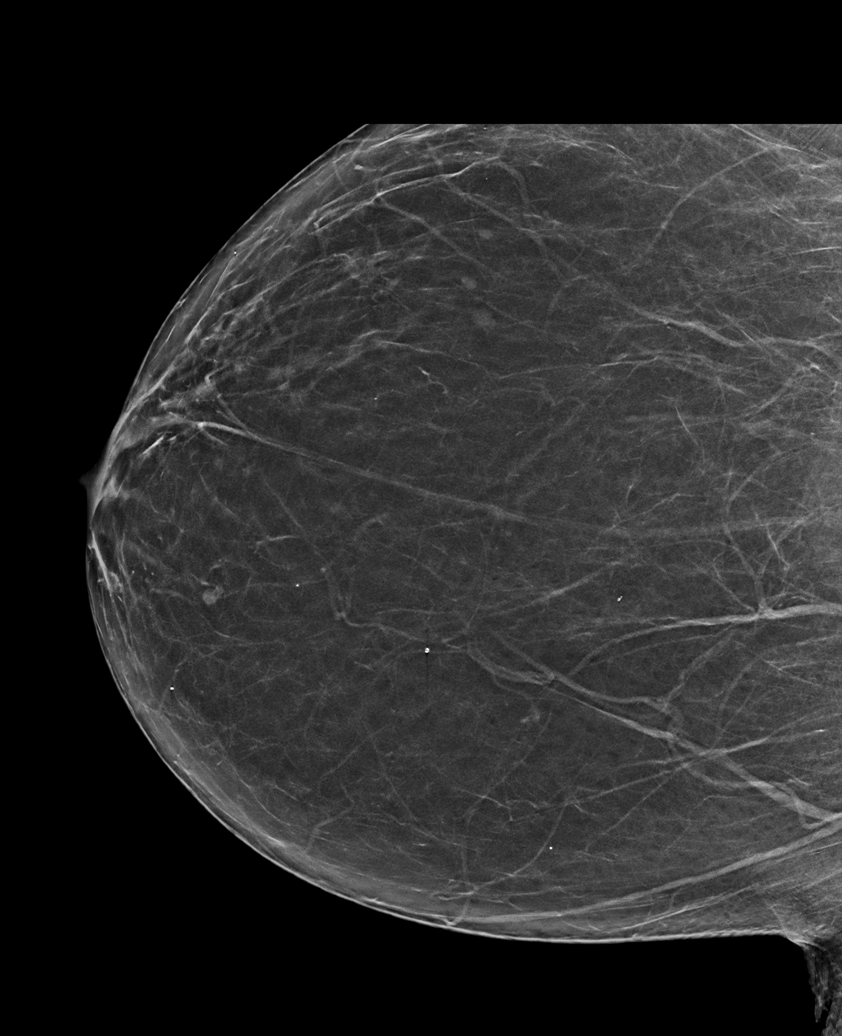

[6 of 36 positions shown; findings below may reference images not displayed]

FINDINGS: There are no findings suspicious for malignancy. Images were
processed with CAD.
IMPRESSION: No mammographic evidence of malignancy. A result letter of this
screening mammogram will be mailed directly to the patient.

RECOMMENDATION:
Screening mammogram in one year. (Code:8Y-[id number])

BI-RADS CATEGORY  1: Negative.

## 2021-03-22 ENCOUNTER — Other Ambulatory Visit: Payer: Self-pay

## 2021-03-22 ENCOUNTER — Ambulatory Visit (INDEPENDENT_AMBULATORY_CARE_PROVIDER_SITE_OTHER): Payer: Managed Care, Other (non HMO)

## 2021-03-22 DIAGNOSIS — J4540 Moderate persistent asthma, uncomplicated: Secondary | ICD-10-CM | POA: Diagnosis not present

## 2021-04-05 ENCOUNTER — Other Ambulatory Visit: Payer: Self-pay

## 2021-04-05 ENCOUNTER — Ambulatory Visit (INDEPENDENT_AMBULATORY_CARE_PROVIDER_SITE_OTHER): Payer: Managed Care, Other (non HMO)

## 2021-04-05 DIAGNOSIS — J4540 Moderate persistent asthma, uncomplicated: Secondary | ICD-10-CM

## 2021-04-19 ENCOUNTER — Other Ambulatory Visit: Payer: Self-pay

## 2021-04-19 ENCOUNTER — Ambulatory Visit: Payer: Managed Care, Other (non HMO)

## 2021-04-19 ENCOUNTER — Ambulatory Visit (INDEPENDENT_AMBULATORY_CARE_PROVIDER_SITE_OTHER): Payer: Managed Care, Other (non HMO)

## 2021-04-19 DIAGNOSIS — J4540 Moderate persistent asthma, uncomplicated: Secondary | ICD-10-CM

## 2021-04-30 ENCOUNTER — Other Ambulatory Visit: Payer: Self-pay | Admitting: Allergy

## 2021-05-01 ENCOUNTER — Other Ambulatory Visit: Payer: Self-pay | Admitting: Allergy

## 2021-05-02 ENCOUNTER — Other Ambulatory Visit: Payer: Self-pay

## 2021-05-02 ENCOUNTER — Ambulatory Visit (INDEPENDENT_AMBULATORY_CARE_PROVIDER_SITE_OTHER): Payer: Managed Care, Other (non HMO)

## 2021-05-02 DIAGNOSIS — J4540 Moderate persistent asthma, uncomplicated: Secondary | ICD-10-CM | POA: Diagnosis not present

## 2021-05-03 ENCOUNTER — Ambulatory Visit: Payer: Managed Care, Other (non HMO)

## 2021-05-17 ENCOUNTER — Ambulatory Visit: Payer: Managed Care, Other (non HMO)

## 2021-05-21 ENCOUNTER — Ambulatory Visit (INDEPENDENT_AMBULATORY_CARE_PROVIDER_SITE_OTHER): Payer: Managed Care, Other (non HMO)

## 2021-05-21 ENCOUNTER — Other Ambulatory Visit: Payer: Self-pay

## 2021-05-21 DIAGNOSIS — J4540 Moderate persistent asthma, uncomplicated: Secondary | ICD-10-CM

## 2021-06-13 ENCOUNTER — Ambulatory Visit (INDEPENDENT_AMBULATORY_CARE_PROVIDER_SITE_OTHER): Payer: Managed Care, Other (non HMO)

## 2021-06-13 ENCOUNTER — Other Ambulatory Visit: Payer: Self-pay

## 2021-06-13 DIAGNOSIS — J4540 Moderate persistent asthma, uncomplicated: Secondary | ICD-10-CM | POA: Diagnosis not present

## 2021-07-02 ENCOUNTER — Ambulatory Visit: Payer: Managed Care, Other (non HMO)

## 2021-07-03 ENCOUNTER — Other Ambulatory Visit: Payer: Self-pay | Admitting: Family Medicine

## 2021-07-05 ENCOUNTER — Other Ambulatory Visit: Payer: Self-pay

## 2021-07-05 ENCOUNTER — Ambulatory Visit (INDEPENDENT_AMBULATORY_CARE_PROVIDER_SITE_OTHER): Payer: Managed Care, Other (non HMO)

## 2021-07-05 DIAGNOSIS — J4540 Moderate persistent asthma, uncomplicated: Secondary | ICD-10-CM | POA: Diagnosis not present

## 2021-07-19 ENCOUNTER — Ambulatory Visit (INDEPENDENT_AMBULATORY_CARE_PROVIDER_SITE_OTHER): Payer: Managed Care, Other (non HMO)

## 2021-07-19 ENCOUNTER — Ambulatory Visit: Payer: Managed Care, Other (non HMO)

## 2021-07-19 DIAGNOSIS — J4540 Moderate persistent asthma, uncomplicated: Secondary | ICD-10-CM

## 2021-08-02 ENCOUNTER — Ambulatory Visit: Payer: Managed Care, Other (non HMO)

## 2021-08-06 ENCOUNTER — Ambulatory Visit (INDEPENDENT_AMBULATORY_CARE_PROVIDER_SITE_OTHER): Payer: Managed Care, Other (non HMO)

## 2021-08-06 ENCOUNTER — Ambulatory Visit: Payer: Managed Care, Other (non HMO)

## 2021-08-06 DIAGNOSIS — J4540 Moderate persistent asthma, uncomplicated: Secondary | ICD-10-CM | POA: Diagnosis not present

## 2021-08-23 ENCOUNTER — Ambulatory Visit (INDEPENDENT_AMBULATORY_CARE_PROVIDER_SITE_OTHER): Payer: Managed Care, Other (non HMO)

## 2021-08-23 DIAGNOSIS — J4540 Moderate persistent asthma, uncomplicated: Secondary | ICD-10-CM | POA: Diagnosis not present

## 2021-09-06 ENCOUNTER — Ambulatory Visit (INDEPENDENT_AMBULATORY_CARE_PROVIDER_SITE_OTHER): Payer: Managed Care, Other (non HMO)

## 2021-09-06 ENCOUNTER — Other Ambulatory Visit: Payer: Self-pay | Admitting: Family Medicine

## 2021-09-06 ENCOUNTER — Other Ambulatory Visit: Payer: Self-pay | Admitting: Allergy

## 2021-09-06 DIAGNOSIS — J4540 Moderate persistent asthma, uncomplicated: Secondary | ICD-10-CM | POA: Diagnosis not present

## 2021-09-20 ENCOUNTER — Ambulatory Visit (INDEPENDENT_AMBULATORY_CARE_PROVIDER_SITE_OTHER): Payer: Managed Care, Other (non HMO)

## 2021-09-20 DIAGNOSIS — J4540 Moderate persistent asthma, uncomplicated: Secondary | ICD-10-CM

## 2021-10-04 ENCOUNTER — Ambulatory Visit (INDEPENDENT_AMBULATORY_CARE_PROVIDER_SITE_OTHER): Payer: Managed Care, Other (non HMO)

## 2021-10-04 DIAGNOSIS — J4540 Moderate persistent asthma, uncomplicated: Secondary | ICD-10-CM | POA: Diagnosis not present

## 2021-10-09 ENCOUNTER — Other Ambulatory Visit: Payer: Self-pay | Admitting: Family Medicine

## 2021-10-16 NOTE — Progress Notes (Signed)
FOLLOW UP Date of Service/Encounter:  10/16/21   Subjective:  Dorothy Baker (DOB: 10/25/1965) is a 56 y.o. female who returns to the [facility] on 10/18/2021 in re-evaluation of the following:  asthma, allergic rhinitis, and reflux History obtained from: chart review and patient.  For Review, LV was on 09/04/20  with Gareth Morgan, FNP seen for routine follow-up.  She was doing well and continued on Xolair for asthma. FEV1 1.58L and spirometry consistent with moderate obstruction, consistent with previous spirometry readings.   Today presents for follow-up. Her asthma is well-controlled.  Occasionally if she gets over heated or too cool she will use her symbicort as needed, but this is infrequent.  She has been on Xolair for about 11 or 12 years. She has tried spacing out to every 3 weeks but was unable to tolerate this.  She does have some problems with getting her injections due to travel and personal life.  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  She is acutely aware when it is time for her Xolair because her asthma will start acting up.    She feels her reflux is controlled.  She takes her omeprazole daily.  She flushes her nose every day and then uses the nasonex.  She also takes her Xyzal daily.  She does follow with ENT due to history of nasal polyps and has had polypectomies on multiple occasions.  Was getting it every 5 years, hasn't had any surgeries in 9 years.     Allergies as of 10/18/2021       Reactions   Aspirin Shortness Of Breath, Other (See Comments), Cough   Asthma flair up Asthma attack   Other Diarrhea, Nausea And Vomiting, Other (See Comments)   Peanut intolerance   Ciprofloxacin Other (See Comments)   Muscle aches    Quinolones Other (See Comments)   Questionable - muscle pain    Soy Allergy    Causes a lot of mucus        Medication List        Accurate as of October 16, 2021   2:16 PM. If you have any questions, ask your nurse or doctor.          AeroChamber Plus inhaler Use as directed with MDI   albuterol 108 (90 Base) MCG/ACT inhaler Commonly known as: VENTOLIN HFA INHALE 2 PUFFS INTO THE LUNGS EVERY 6 HOURS AS NEEDED FOR WHEEZING OR SHORTNESS OF BREATH   amitriptyline 25 MG tablet Commonly known as: ELAVIL Take 25 mg by mouth at bedtime as needed for sleep.   amphetamine-dextroamphetamine 30 MG 24 hr capsule Commonly known as: ADDERALL XR Take 30 mg by mouth daily.   atorvastatin 20 MG tablet Commonly known as: LIPITOR Take 1 tablet by mouth daily.   Contrave 8-90 MG Tb12 Generic drug: Naltrexone-buPROPion HCl ER Take 1 tablet by mouth See admin instructions. Take 1 tablet by mouth twice daily, may increase to up to a max of 2 tablets by mouth twice daily as directed by doctor   dexamethasone 0.1 % ophthalmic solution Commonly known as: DECADRON Apply 2 drops in right ear twice daily for 7 days.   levocetirizine 5 MG tablet Commonly known as: XYZAL Take 1 tablet (5 mg total) by mouth every evening.   mometasone 50 MCG/ACT nasal spray Commonly known as: NASONEX SHAKE LIQUID AND USE 1 SPRAY IN EACH NOSTRIL TWICE DAILY  montelukast 10 MG tablet Commonly known as: SINGULAIR TAKE 1 TABLET(10 MG) BY MOUTH AT BEDTIME   multivitamin with minerals tablet Take 1 tablet by mouth daily.   omeprazole 20 MG capsule Commonly known as: PRILOSEC Take 1 capsule (20 mg total) by mouth daily.   onetouch ultrasoft lancets Check fasting glucose readings once daily.   Symbicort 160-4.5 MCG/ACT inhaler Generic drug: budesonide-formoterol Inhale 2 puffs into the lungs 2 (two) times daily.   valsartan-hydrochlorothiazide 160-12.5 MG tablet Commonly known as: DIOVAN-HCT Take 1 tablet by mouth in the morning and at bedtime.   Victoza 18 MG/3ML Sopn Generic drug: liraglutide Inject into the skin.   Vitamin D3 125 MCG (5000 UT) Caps Take 5,000  Units by mouth daily.   Vyvanse 30 MG capsule Generic drug: lisdexamfetamine Take 30 mg by mouth daily.   Xolair 150 MG injection Generic drug: omalizumab Inject 375 mg dose into the skin every 14 (fourteen) days.       Past Medical History:  Diagnosis Date   Allergy    Asthma    Cystic thyroid nodule    right   Diabetes mellitus    Fibroids    GERD (gastroesophageal reflux disease)    Hypertension    Past Surgical History:  Procedure Laterality Date   CESAREAN SECTION     ROTATOR CUFF REPAIR  2021   THYROIDECTOMY Right 10/08/2019   Procedure: THYROIDECTOMY;  Surgeon: Melida Quitter, MD;  Location: [facility] OR;  Service: ENT;  Laterality: Right;   TYMPANOSTOMY TUBE PLACEMENT Bilateral 07/04/2020   Otherwise, there have been no changes to her past medical history, surgical history, family history, or social history.  ROS: All others negative except as noted per HPI.   Objective:  There were no vitals taken for this visit. There is no height or weight on file to calculate BMI. Physical Exam: General Appearance:  Alert, cooperative, no distress, appears stated age  Head:  Normocephalic, without obvious abnormality, atraumatic  Eyes:  Conjunctiva clear, EOM's intact  Nose: Nares normal, hypertrophic turbinates, normal mucosa, no visible anterior polyps, and septum midline  Throat: Lips, tongue normal; teeth and gums normal, normal posterior oropharynx  Neck: Supple, symmetrical  Lungs:   clear to auscultation bilaterally, Respirations unlabored, no coughing  Heart:  regular rate and rhythm and no murmur, Appears well perfused  Extremities: No edema  Skin: Skin color, texture, turgor normal, no rashes or lesions on visualized portions of skin  Neurologic: No gross deficits  Spirometry:  Tracings reviewed. Her effort: Good reproducible efforts. FVC: 2.12L FEV1: 1.58L, 65% predicted FEV1/FVC ratio: 94% Interpretation:  Nonobstructive pattern, possible restriction suspect due  to technique .  Please see scanned spirometry results for details.  Assessment/Plan  She has been controlled on Xolair for many years now, but is inconvenienced by having to come to our office for injections multiple times per month.  Discussed trial of home dosing and/or could consider switching to another biologic with less frequent dosing intervals.  However, Xolair is working for her so ideally we are able to get her on home dosing.  Moderate persistent asthma-controlled Breathing test looked great today. Continue montelukast 10 mg once a day to help prevent cough and wheeze Continue Symbicort 160/4.5 -2 puffs 1-2 times a day with a spacer to prevent cough and wheeze Continue Xolair 375 mg injections every 2 weeks and have access to an epinephrine auto-injector set. Will try switching to home dosing Use albuterol 2 puffs every 4 hours as needed for  cough or wheeze  Allergic rhinoconjunctivitis with history of nasal polyposis-controlled Continue Xyzal 5 mg once a day as needed for runny nose Continue Nasonex 2 sprays each nostril once a day as needed for stuffy nose May use saline nasal rinse or saline nasal spray as needed to help with nasal symptoms.  Use this prior to any medicated nasal sprays  Reflux-controlled Continue omeprazole 20 mg once a day Continue dietary and lifestyle modifications.  Call the clinic if this treatment plan is not working well for you  Follow up in 6 months or sooner if needed. It was so nice to meet you today! Sigurd Sos, MD [facility]   Sigurd Sos, MD  [facility]

## 2021-10-18 ENCOUNTER — Encounter: Payer: Self-pay | Admitting: Internal Medicine

## 2021-10-18 ENCOUNTER — Ambulatory Visit: Payer: Managed Care, Other (non HMO) | Admitting: Internal Medicine

## 2021-10-18 VITALS — BP 134/80 | HR 88 | Temp 97.7°F | Resp 17 | Ht 67.0 in | Wt 225.1 lb

## 2021-10-18 DIAGNOSIS — J4540 Moderate persistent asthma, uncomplicated: Secondary | ICD-10-CM

## 2021-10-18 DIAGNOSIS — K219 Gastro-esophageal reflux disease without esophagitis: Secondary | ICD-10-CM | POA: Diagnosis not present

## 2021-10-18 DIAGNOSIS — J339 Nasal polyp, unspecified: Secondary | ICD-10-CM | POA: Diagnosis not present

## 2021-10-18 DIAGNOSIS — J309 Allergic rhinitis, unspecified: Secondary | ICD-10-CM | POA: Diagnosis not present

## 2021-10-18 DIAGNOSIS — H1010 Acute atopic conjunctivitis, unspecified eye: Secondary | ICD-10-CM

## 2021-10-18 DIAGNOSIS — J4541 Moderate persistent asthma with (acute) exacerbation: Secondary | ICD-10-CM

## 2021-10-18 DIAGNOSIS — H1013 Acute atopic conjunctivitis, bilateral: Secondary | ICD-10-CM

## 2021-10-18 NOTE — Patient Instructions (Addendum)
Moderate persistent asthma-controlled Breathing test looked great today. Continue montelukast 10 mg once a day to help prevent cough and wheeze Continue Symbicort 160/4.5 -2 puffs 1-2 times a day with a spacer to prevent cough and wheeze Continue Xolair 375 mg injections every 2 weeks and have access to an epinephrine auto-injector set. Will try switching to home dosing Use albuterol 2 puffs every 4 hours as needed for cough or wheeze  Allergic rhinoconjunctivitis with history of nasal polyposis Continue Xyzal 5 mg once a day as needed for runny nose Continue Nasonex 2 sprays each nostril once a day as needed for stuffy nose May use saline nasal rinse or saline nasal spray as needed to help with nasal symptoms.  Use this prior to any medicated nasal sprays  Reflux Continue omeprazole 20 mg once a day Continue dietary and lifestyle modifications.  Call the clinic if this treatment plan is not working well for you  Follow up in 6 months or sooner if needed. It was so nice to meet you today! Sigurd Sos, MD [facility]

## 2021-10-19 NOTE — Addendum Note (Signed)
Addended by: Felipa Emory on: 10/19/2021 08:08 AM   Modules accepted: Orders

## 2021-10-24 ENCOUNTER — Telehealth: Payer: Self-pay

## 2021-10-24 NOTE — Telephone Encounter (Signed)
Pt was wondering about th at home auto injectors for her xolair? She had not heard anything and was just wondering since you mentioned it.

## 2021-10-25 NOTE — Telephone Encounter (Signed)
Hi Carrie-Let her know that Lynelle Smoke is working on it and will reach out once approved. Thanks

## 2021-11-01 ENCOUNTER — Ambulatory Visit: Payer: Managed Care, Other (non HMO)

## 2021-11-05 ENCOUNTER — Ambulatory Visit: Payer: Managed Care, Other (non HMO)

## 2021-11-05 DIAGNOSIS — J4540 Moderate persistent asthma, uncomplicated: Secondary | ICD-10-CM | POA: Diagnosis not present

## 2021-11-05 MED ORDER — OMALIZUMAB 150 MG/ML ~~LOC~~ SOSY
375.0000 mg | PREFILLED_SYRINGE | SUBCUTANEOUS | 11 refills | Status: DC
Start: 2021-11-05 — End: 2022-11-26

## 2021-11-05 MED ORDER — OMALIZUMAB 75 MG/0.5ML ~~LOC~~ SOSY: 375.0000 mg | PREFILLED_SYRINGE | SUBCUTANEOUS | 11 refills | Status: DC

## 2021-11-05 NOTE — Addendum Note (Signed)
Addended by: Carin Hock on: 11/05/2021 09:25 AM   Modules accepted: Orders

## 2021-11-05 NOTE — Telephone Encounter (Signed)
L/m for patient new rx for '150mg'$  and '75mg'$  Xolair sent to [facility]. Was waiting on approval for both doses to send.  Advised patient to reach out to pharmacy in 48 hrs to set up delivery for same to home

## 2021-11-05 NOTE — Telephone Encounter (Signed)
Thanks

## 2021-11-09 ENCOUNTER — Other Ambulatory Visit: Payer: Self-pay

## 2021-11-09 MED ORDER — SYMBICORT 160-4.5 MCG/ACT IN AERO: 2.0000 | INHALATION_SPRAY | Freq: Two times a day (BID) | RESPIRATORY_TRACT | 1 refills | Status: DC

## 2021-11-09 MED ORDER — LEVOCETIRIZINE DIHYDROCHLORIDE 5 MG PO TABS: 5.0000 mg | ORAL_TABLET | Freq: Every evening | ORAL | 1 refills | Status: DC

## 2021-11-09 MED ORDER — MOMETASONE FUROATE 50 MCG/ACT NA SUSP: NASAL | 4 refills | Status: DC

## 2021-11-26 ENCOUNTER — Ambulatory Visit: Payer: Managed Care, Other (non HMO)

## 2022-01-21 ENCOUNTER — Telehealth: Payer: Self-pay

## 2022-01-21 NOTE — Telephone Encounter (Signed)
Pts speciality pharmacy [facility]  called and said that they have reached out to the pt several times via phone and even sending a letter with no response so Dorothy Baker is not shipping until pt calls them back

## 2022-01-21 NOTE — Telephone Encounter (Signed)
She gets it at home now she can reach out to them

## 2022-01-29 ENCOUNTER — Ambulatory Visit (INDEPENDENT_AMBULATORY_CARE_PROVIDER_SITE_OTHER): Payer: Managed Care, Other (non HMO)

## 2022-01-29 ENCOUNTER — Other Ambulatory Visit: Payer: Self-pay | Admitting: Family Medicine

## 2022-01-29 DIAGNOSIS — J4540 Moderate persistent asthma, uncomplicated: Secondary | ICD-10-CM | POA: Diagnosis not present

## 2022-01-29 MED ORDER — OMALIZUMAB 150 MG ~~LOC~~ SOLR
375.0000 mg | SUBCUTANEOUS | Status: DC
  Administered 2022-01-29 – 2022-02-12 (×2): 375 mg via SUBCUTANEOUS

## 2022-01-30 ENCOUNTER — Ambulatory Visit: Payer: Managed Care, Other (non HMO)

## 2022-02-12 ENCOUNTER — Ambulatory Visit: Payer: Managed Care, Other (non HMO)

## 2022-02-12 DIAGNOSIS — J4540 Moderate persistent asthma, uncomplicated: Secondary | ICD-10-CM | POA: Diagnosis not present

## 2022-04-27 ENCOUNTER — Other Ambulatory Visit: Payer: Self-pay | Admitting: Family Medicine

## 2022-04-29 ENCOUNTER — Other Ambulatory Visit: Payer: Self-pay | Admitting: Family Medicine

## 2022-05-11 ENCOUNTER — Other Ambulatory Visit: Payer: Self-pay | Admitting: Internal Medicine

## 2022-06-05 ENCOUNTER — Telehealth: Payer: Self-pay | Admitting: Family Medicine

## 2022-06-05 ENCOUNTER — Other Ambulatory Visit: Payer: Self-pay | Admitting: Family Medicine

## 2022-06-05 ENCOUNTER — Other Ambulatory Visit: Payer: Self-pay | Admitting: *Deleted

## 2022-06-05 MED ORDER — MONTELUKAST SODIUM 10 MG PO TABS: 10.0000 mg | ORAL_TABLET | Freq: Every day | ORAL | 0 refills | Status: DC

## 2022-06-05 NOTE — Telephone Encounter (Signed)
Pt has OV scheduled for 3/4, pt is requesting an refill on montelukast until her appt. Pt states she was not aware she needed to be seen every 6 mnths.

## 2022-06-05 NOTE — Telephone Encounter (Signed)
Montelukast refilled x1 to [facility] on file.

## 2022-06-19 NOTE — Patient Instructions (Incomplete)
Moderate persistent asthma-controlled Continue montelukast 10 mg once a day to help prevent cough and wheeze Continue Symbicort 160/4.5 -2 puffs 1-2 times a day with a spacer to prevent cough and wheeze Continue Xolair 375 mg injections every 2 weeks at home and have access to an epinephrine auto-injector set.  Use albuterol 2 puffs every 4 hours as needed for cough or wheeze  Allergic rhinoconjunctivitis with history of nasal polyposis Continue Xyzal 5 mg once a day as needed for runny nose Continue Nasonex 2 sprays each nostril once a day as needed for stuffy nose May use saline nasal rinse or saline nasal spray as needed to help with nasal symptoms.  Use this prior to any medicated nasal sprays  Reflux Continue omeprazole 20 mg once a day Continue dietary and lifestyle modifications.  Steroid responsive rash Start triamcinolone 0.1% cream using 1 application sparingly twice a day as needed to the itchy area on your back. Do not use this on your face, neck, groin, or armpit region  Call the clinic if this treatment plan is not working well for you  Follow up in 6 months or sooner if needed.

## 2022-06-20 ENCOUNTER — Ambulatory Visit (INDEPENDENT_AMBULATORY_CARE_PROVIDER_SITE_OTHER): Payer: Managed Care, Other (non HMO) | Admitting: Family

## 2022-06-20 ENCOUNTER — Encounter: Payer: Self-pay | Admitting: Family

## 2022-06-20 VITALS — BP 126/88 | HR 82 | Temp 97.9°F | Resp 17 | Ht 67.0 in | Wt 223.9 lb

## 2022-06-20 DIAGNOSIS — K219 Gastro-esophageal reflux disease without esophagitis: Secondary | ICD-10-CM

## 2022-06-20 DIAGNOSIS — J339 Nasal polyp, unspecified: Secondary | ICD-10-CM

## 2022-06-20 DIAGNOSIS — H1013 Acute atopic conjunctivitis, bilateral: Secondary | ICD-10-CM

## 2022-06-20 DIAGNOSIS — J4540 Moderate persistent asthma, uncomplicated: Secondary | ICD-10-CM

## 2022-06-20 DIAGNOSIS — H1010 Acute atopic conjunctivitis, unspecified eye: Secondary | ICD-10-CM

## 2022-06-20 DIAGNOSIS — J309 Allergic rhinitis, unspecified: Secondary | ICD-10-CM

## 2022-06-20 DIAGNOSIS — R21 Rash and other nonspecific skin eruption: Secondary | ICD-10-CM

## 2022-06-20 MED ORDER — ALBUTEROL SULFATE HFA 108 (90 BASE) MCG/ACT IN AERS: 2.0000 | INHALATION_SPRAY | Freq: Four times a day (QID) | RESPIRATORY_TRACT | 0 refills | Status: DC | PRN

## 2022-06-20 MED ORDER — SYMBICORT 160-4.5 MCG/ACT IN AERO: 2.0000 | INHALATION_SPRAY | Freq: Two times a day (BID) | RESPIRATORY_TRACT | 1 refills | Status: DC

## 2022-06-20 MED ORDER — TRIAMCINOLONE ACETONIDE 0.1 % EX CREA: 1.0000 | TOPICAL_CREAM | Freq: Two times a day (BID) | CUTANEOUS | 1 refills | Status: DC

## 2022-06-20 MED ORDER — EPINEPHRINE 0.3 MG/0.3ML IJ SOAJ: 0.3000 mg | INTRAMUSCULAR | 1 refills | Status: DC | PRN

## 2022-06-20 MED ORDER — OMEPRAZOLE 20 MG PO CPDR: 20.0000 mg | DELAYED_RELEASE_CAPSULE | Freq: Every day | ORAL | 1 refills | Status: AC

## 2022-06-20 MED ORDER — MONTELUKAST SODIUM 10 MG PO TABS: 10.0000 mg | ORAL_TABLET | Freq: Every day | ORAL | 1 refills | Status: DC

## 2022-06-20 MED ORDER — LEVOCETIRIZINE DIHYDROCHLORIDE 5 MG PO TABS: ORAL_TABLET | ORAL | 1 refills | Status: DC

## 2022-06-20 NOTE — Progress Notes (Signed)
[place] [address] Dept: [phone]  FOLLOW UP NOTE  Patient ID: Dorothy Baker, female    DOB: April 22, 1966  Age: 57 y.o. MRN: [id number] Date of Office Visit: 06/20/2022  Assessment  Chief Complaint: Follow-up (Pt states she's been doing well.) and Asthma  HPI Dorothy Baker is a 56 year old female who presents today for follow-up of moderate persistent asthma, allergic rhinoconjunctivitis with a history of nasal polyposis, and reflux she was last seen on October 16, 2021 by Dr. Simona Huh.  She denies any new diagnosis or surgery since her last office visit.  Moderate persistent asthma: She is currently taking montelukast 10 mg once a day, Symbicort 160/4.5 mcg 2 puffs once a day, Xolair 375 mg injection every 2 weeks at home, and albuterol as needed.  She denies cough, wheeze, tightness in chest, shortness of breath, and nocturnal awakenings due to breathing problems.  Since her last office visit she has not required any systemic steroids or made any trips to the emergency room or urgent care due to breathing problems.  She does feel like her Xolair injections have helped her asthma.  She denies any problems or reactions with her Xolair injections. She reports that she does have an EpiPen and knows how to use it.  Allergic rhinoconjunctivitis with history of nasal polyposis: She continues to take Xyzal 5 mg once a day.  She is out of Nasonex nasal spray.  She is not having to buy it over-the-counter due to her insurance not paying for it.  She reports that she will have her ears clogged up with fluid if she has an allergy flareup.  She denies rhinorrhea, nasal congestion, and postnasal drip.  She has not had any sinus infections since we last saw her.  She does do sinus Navage twice a day. She has had 3 sinus surgeries, with her last one being around 2013.  Reflux is reported as controlled with omeprazole 20 mg once a day.  She denies any heartburn or reflux  symptoms.  Rash: She reports that she has had an itchy rash on her back for approximately 2 years or longer.  She did have a cream that she used for a while  that did help.  She does not know the name of the cream she had because this was a while back.   Drug Allergies:  Allergies  Allergen Reactions   Aspirin Shortness Of Breath, Other (See Comments) and Cough    Asthma flair up Asthma attack    Other Diarrhea, Nausea And Vomiting and Other (See Comments)    Peanut intolerance   Ciprofloxacin Other (See Comments)    Muscle aches    Quinolones Other (See Comments)    Questionable - muscle pain    Soy Allergy     Causes a lot of mucus    Review of Systems: Review of Systems  Constitutional:  Negative for chills and fever.  Eyes:        Denies itchy watery eyes  Respiratory:  Negative for cough, shortness of breath and wheezing.        Denies cough, wheeze, tightness in chest,  shortness of breath, or nocturnal awakenings due to breathing problems  Cardiovascular:  Negative for chest pain and palpitations.  Gastrointestinal:        Denies heartburn or reflux symptoms while omeprazole 20 mg once a day  Genitourinary:  Negative for frequency.  Skin:  Positive for itching and rash.  Reports itchy rash on back for around 2 years or longer that responded to a cream that she had awhile back  Neurological:  Negative for headaches.  Endo/Heme/Allergies:  Positive for environmental allergies.     Physical Exam: BP 126/88   Pulse 82   Temp 97.9 F (36.6 C) (Temporal)   Resp 17   Ht '5\' 7"'$  (1.702 m)   Wt 223 lb 14.4 oz (101.6 kg)   SpO2 98%   BMI 35.07 kg/m    Physical Exam Constitutional:      Appearance: Normal appearance.  HENT:     Head: Normocephalic and atraumatic.     Comments: Pharynx normal. Eyes normal. Ears: Right ear normal, left ear blue PE tube present. Nose normal    Right Ear: Tympanic membrane, ear canal and external ear normal.     Left Ear: Ear  canal and external ear normal.     Nose: Nose normal.     Mouth/Throat:     Mouth: Mucous membranes are moist.     Pharynx: Oropharynx is clear.  Eyes:     Conjunctiva/sclera: Conjunctivae normal.  Cardiovascular:     Rate and Rhythm: Normal rate and regular rhythm.     Heart sounds: Normal heart sounds.  Pulmonary:     Effort: Pulmonary effort is normal.     Breath sounds: Normal breath sounds.     Comments: Lungs clear to auscultation Musculoskeletal:     Cervical back: Neck supple.  Skin:    General: Skin is warm.     Comments: Fine sand paper like rash with hypopigmentation on left side of mid/upper back  Neurological:     Mental Status: She is alert and oriented to person, place, and time.  Psychiatric:        Mood and Affect: Mood normal.        Behavior: Behavior normal.        Thought Content: Thought content normal.        Judgment: Judgment normal.     Diagnostics: FVC 2.22 L (73%), FEV1 1.56 L (64%).  Predicted FVC 3.06 L, predicted FEV1 2.42 L.  Spirometry indicates possible moderate restriction.  Spirometry is consistent with previous spirometry.  Assessment and Plan: 1. Moderate persistent asthma without complication   2. Allergic rhinoconjunctivitis   3. Nasal polyposis   4. Gastroesophageal reflux disease, unspecified whether esophagitis present   5. Rash and nonspecific skin eruption     Meds ordered this encounter  Medications   albuterol (VENTOLIN HFA) 108 (90 Base) MCG/ACT inhaler    Sig: Inhale 2 puffs into the lungs every 6 (six) hours as needed for wheezing or shortness of breath.    Dispense:  54 g    Refill:  0    **Patient requests 90 days supply**   levocetirizine (XYZAL) 5 MG tablet    Sig: TAKE 1 TABLET(5 MG) BY MOUTH EVERY EVENING    Dispense:  90 tablet    Refill:  1   montelukast (SINGULAIR) 10 MG tablet    Sig: Take 1 tablet (10 mg total) by mouth at bedtime.    Dispense:  90 tablet    Refill:  1   omeprazole (PRILOSEC) 20 MG  capsule    Sig: Take 1 capsule (20 mg total) by mouth daily.    Dispense:  90 capsule    Refill:  1   SYMBICORT 160-4.5 MCG/ACT inhaler    Sig: Inhale 2 puffs into the lungs 2 (two) times daily.  Dispense:  30.6 g    Refill:  1    May dispense 90 day supply   triamcinolone cream (KENALOG) 0.1 %    Sig: Apply 1 Application topically 2 (two) times daily. Use 1 application sparingly twice a day as needed to the area on your back. Do not use on your face, neck, groin, or armpit region    Dispense:  30 g    Refill:  1   EPINEPHrine 0.3 mg/0.3 mL IJ SOAJ injection    Sig: Inject 0.3 mg into the muscle as needed for anaphylaxis.    Dispense:  1 each    Refill:  1    Patient Instructions  Moderate persistent asthma-controlled Continue montelukast 10 mg once a day to help prevent cough and wheeze Continue Symbicort 160/4.5 -2 puffs 1-2 times a day with a spacer to prevent cough and wheeze Continue Xolair 375 mg injections every 2 weeks at home and have access to an epinephrine auto-injector set.  Use albuterol 2 puffs every 4 hours as needed for cough or wheeze  Allergic rhinoconjunctivitis with history of nasal polyposis Continue Xyzal 5 mg once a day as needed for runny nose Continue Nasonex 2 sprays each nostril once a day as needed for stuffy nose May use saline nasal rinse or saline nasal spray as needed to help with nasal symptoms.  Use this prior to any medicated nasal sprays  Reflux Continue omeprazole 20 mg once a day Continue dietary and lifestyle modifications.  Steroid responsive rash Start triamcinolone 0.1% cream using 1 application sparingly twice a day as needed to the itchy area on your back. Do not use this on your face, neck, groin, or armpit region  Call the clinic if this treatment plan is not working well for you  Follow up in 6 months or sooner if needed.   Return in about 6 months (around 12/19/2022), or if symptoms worsen or fail to improve.    Thank you  for the opportunity to care for this patient.  Please do not hesitate to contact me with questions.  Althea Charon, FNP [facility]

## 2022-06-24 ENCOUNTER — Ambulatory Visit: Payer: Managed Care, Other (non HMO) | Admitting: Internal Medicine

## 2022-11-06 ENCOUNTER — Other Ambulatory Visit: Payer: Self-pay | Admitting: Internal Medicine

## 2022-11-22 ENCOUNTER — Other Ambulatory Visit: Payer: Self-pay | Admitting: Internal Medicine

## 2022-11-26 ENCOUNTER — Telehealth: Payer: Self-pay | Admitting: Family

## 2022-11-26 ENCOUNTER — Other Ambulatory Visit: Payer: Self-pay | Admitting: *Deleted

## 2022-11-26 MED ORDER — OMALIZUMAB 150 MG/ML ~~LOC~~ SOSY: 375.0000 mg | PREFILLED_SYRINGE | SUBCUTANEOUS | 11 refills | Status: DC

## 2022-11-26 MED ORDER — OMALIZUMAB 75 MG/0.5ML ~~LOC~~ SOSY: 375.0000 mg | PREFILLED_SYRINGE | SUBCUTANEOUS | 11 refills | Status: DC

## 2022-11-26 NOTE — Telephone Encounter (Signed)
Magenta called from [facility] to get refill on medications  omalizumab omalizumab Geoffry Paradise) 75 MG/0.5ML prefilled syringe, omalizumab omalizumab Geoffry Paradise) 150 MG/ML prefilled syringe for patient call back number [phone].

## 2022-11-26 NOTE — Telephone Encounter (Signed)
Rx sent to [facility]

## 2022-11-28 ENCOUNTER — Ambulatory Visit: Payer: Managed Care, Other (non HMO) | Admitting: Family

## 2022-11-28 NOTE — Patient Instructions (Addendum)
Moderate persistent asthma-controlled Continue montelukast 10 mg once a day to help prevent cough and wheeze Continue Symbicort 160/4.5 -2 puffs 1-2 times a day with a spacer to prevent cough and wheeze Continue Xolair 375 mg injections every 2 weeks at home and have access to an epinephrine auto-injector set.  Use albuterol 2 puffs every 4 hours as needed for cough or wheeze  Allergic rhinoconjunctivitis with history of nasal polyposis Start prednisone 10 mg taking 1 tablet twice once a day for 4 days. Please schedule an appointment with your ENT due to possible left nostril nasal polyp Continue Xyzal 5 mg once a day as needed for runny nose Continue Nasonex 2 sprays each nostril once a day as needed for stuffy nose Start azelastine using 1-2 sprays in each nostril once  to twice a day as needed for runny nose/drainage down throat. Let us know if insurance does not cover azelastine nasal spray and we will send in ipratropium bromide May use saline nasal rinse or saline nasal spray as needed to help with nasal symptoms.  Use this prior to any medicated nasal sprays  Reflux Continue omeprazole 20 mg once a day Continue dietary and lifestyle modifications.  Steroid responsive rash Stop triamcinolone 0.1% cream Start triamcinolone 0.5% using 1 application sparingly twice a day as needed to the itchy area on your back. Do not use this on your face, neck, groin, or armpit region  Call the clinic if this treatment plan is not working well for you  Follow up in 2 months or sooner if needed.

## 2022-11-29 ENCOUNTER — Encounter: Payer: Self-pay | Admitting: Family

## 2022-11-29 ENCOUNTER — Ambulatory Visit (INDEPENDENT_AMBULATORY_CARE_PROVIDER_SITE_OTHER): Payer: Managed Care, Other (non HMO) | Admitting: Family

## 2022-11-29 VITALS — BP 118/84 | HR 113 | Temp 97.2°F | Resp 18

## 2022-11-29 DIAGNOSIS — J339 Nasal polyp, unspecified: Secondary | ICD-10-CM

## 2022-11-29 DIAGNOSIS — J309 Allergic rhinitis, unspecified: Secondary | ICD-10-CM | POA: Diagnosis not present

## 2022-11-29 DIAGNOSIS — K219 Gastro-esophageal reflux disease without esophagitis: Secondary | ICD-10-CM

## 2022-11-29 DIAGNOSIS — R21 Rash and other nonspecific skin eruption: Secondary | ICD-10-CM | POA: Diagnosis not present

## 2022-11-29 DIAGNOSIS — J4540 Moderate persistent asthma, uncomplicated: Secondary | ICD-10-CM

## 2022-11-29 DIAGNOSIS — H1010 Acute atopic conjunctivitis, unspecified eye: Secondary | ICD-10-CM

## 2022-11-29 DIAGNOSIS — H1013 Acute atopic conjunctivitis, bilateral: Secondary | ICD-10-CM

## 2022-11-29 MED ORDER — AZELASTINE HCL 0.1 % NA SOLN
NASAL | 5 refills | Status: DC
Start: 2022-11-29 — End: 2023-08-15

## 2022-11-29 MED ORDER — TRIAMCINOLONE ACETONIDE 0.5 % EX OINT: TOPICAL_OINTMENT | CUTANEOUS | 2 refills | Status: AC

## 2022-11-29 MED ORDER — PREDNISONE 10 MG PO TABS: ORAL_TABLET | ORAL | 0 refills | Status: DC

## 2022-11-29 NOTE — Progress Notes (Signed)
[address] [place] [address] Dept: [phone]  FOLLOW UP NOTE  Patient ID: Dorothy Baker, female    DOB: June 06, 1965  Age: 57 y.o. MRN: [id number] Date of Office Visit: 11/29/2022  Assessment  Chief Complaint: Rash (Still has a rash)  HPI Dorothy Baker is a 57 year old female presents today for follow-up of moderate persistent asthma without complication, allergic rhinoconjunctivitis, nasal polyposis, gastroesophageal reflux disease, and rash and nonspecific skin eruption.  She was last seen on June 19, 2022 by myself.  She denies any new diagnoses or surgeries since her last office visit.  Moderate persistent asthma: She continues to take Symbicort 160/4.5 mcg 2 puffs once a day to twice a day, montelukast 10 mg once a day, Xolair 375 mg injections every 2 weeks at home, and albuterol as needed.  She denies cough, wheeze, tightness in chest, shortness of breath, and nocturnal awakenings due to breathing problems.  She uses her albuterol inhaler maybe 4 times a month.  She has made 1 trip to urgent care at the end of May where she was diagnosed with asthma exacerbation.  She was given prednisone.  She reports that she did up testing positive for COVID at that time.  She has not received any other steroids other than the one at the end of May.  Allergic rhinoconjunctivitis with a history of nasal polyposis.  She reports that she would like a low-dose round of prednisone.  She reports that she was in [place] for 2 weeks and did not get her Xolair on time.  She reports that she cannot smell or taste.  She reports that Xolair normally keeps her nasal polyps at bay.  The only thing she can taste is sweet foods, salt, pepper, and sour foods.  She has not seen ENT in a while.  She reports that she was instructed to come back when she cannot taste or smell.  Her last sinus surgery was around 2013.  She has had 3 sinus surgeries.  She reports that she always has postnasal  drip.  She denies rhinorrhea and nasal congestion.  She has not been treated for any sinus infections since we last saw her.  She continues to take Xyzal 5 mg in the morning, Singulair 10 mg at night, Nasonex 2 sprays each nostril in the morning, and she flushes twice a day.  Reflux is reported doing good.  She continues to take omeprazole 20 mg once a day.  She denies heartburn or reflux symptoms.  Steroid responsive rash: She reports the triamcinolone 0.1% cream that was prescribed at her last office did not really help.  Her wife has triamcinolone 0.5% ointment and this helps get rid of the rash.  She is requesting a refill.  She reports the rash is itchy at times.  The rash is on her back.  She does not want a referral to dermatology.   Drug Allergies:  Allergies  Allergen Reactions   Aspirin Shortness Of Breath, Other (See Comments) and Cough    Asthma flair up Asthma attack    Other Diarrhea, Nausea And Vomiting and Other (See Comments)    Peanut intolerance   Ciprofloxacin Other (See Comments)    Muscle aches    Quinolones Other (See Comments)    Questionable - muscle pain    Soy Allergy     Causes a lot of mucus    Review of Systems: Negative except as per HPI  Physical Exam: BP 118/84 (BP Location:  Right Arm, Patient Position: Sitting, Cuff Size: Normal)   Pulse (!) 113   Temp (!) 97.2 F (36.2 C) (Temporal)   Resp 18   SpO2 96%    Physical Exam Constitutional:      Appearance: Normal appearance.  HENT:     Head: Normocephalic and atraumatic.     Comments: Pharynx normal, eyes normal, ears: Right ear normal, left ear: Blue PE tube present.  Nose right nostril normal.  Left nostril: Possible nasal polyp present.    Right Ear: Tympanic membrane, ear canal and external ear normal.     Left Ear: Ear canal and external ear normal.     Mouth/Throat:     Mouth: Mucous membranes are moist.     Pharynx: Oropharynx is clear.  Eyes:     Conjunctiva/sclera: Conjunctivae  normal.  Cardiovascular:     Rate and Rhythm: Regular rhythm.     Heart sounds: Normal heart sounds.  Pulmonary:     Effort: Pulmonary effort is normal.     Breath sounds: Normal breath sounds.     Comments: Lungs clear to auscultation Musculoskeletal:     Cervical back: Neck supple.  Skin:    General: Skin is warm.     Comments: Fine sandpaper like rash with hypopigmentation noted on the left side of mid upper back.  Neurological:     Mental Status: She is alert and oriented to person, place, and time.  Psychiatric:        Mood and Affect: Mood normal.        Behavior: Behavior normal.        Thought Content: Thought content normal.        Judgment: Judgment normal.     Diagnostics: FVC 2.55 L (84%), FEV1 1.77 L (73%).  Predicted FVC 3.05 L, predicted FEV1 2.41 L.  Spirometry indicates normal respiratory function.  Assessment and Plan: 1. Nasal polyposis   2. Moderate persistent asthma without complication   3. Allergic rhinoconjunctivitis   4. Rash and nonspecific skin eruption   5. Gastroesophageal reflux disease, unspecified whether esophagitis present     Meds ordered this encounter  Medications   azelastine (ASTELIN) 0.1 % nasal spray    Sig: Use 1 to 2 sprays in each nostril once to twice a day as needed for runny nose/drainage down throat    Dispense:  30 mL    Refill:  5   triamcinolone ointment (KENALOG) 0.5 %    Sig: Use 1 application sparingly twice a day as needed to red itchy areas.  Do not use longer than 2 weeks in a row    Dispense:  30 g    Refill:  2   predniSONE (DELTASONE) 10 MG tablet    Sig: Take 1 tablet once a day for 4 days and then stop    Dispense:  4 tablet    Refill:  0    Patient Instructions  Moderate persistent asthma-controlled Continue montelukast 10 mg once a day to help prevent cough and wheeze Continue Symbicort 160/4.5 -2 puffs 1-2 times a day with a spacer to prevent cough and wheeze Continue Xolair 375 mg injections every  2 weeks at home and have access to an epinephrine auto-injector set.  Use albuterol 2 puffs every 4 hours as needed for cough or wheeze  Allergic rhinoconjunctivitis with history of nasal polyposis Start prednisone 10 mg taking 1 tablet twice once a day for 4 days. Please schedule an appointment with your ENT due  to possible left nostril nasal polyp Continue Xyzal 5 mg once a day as needed for runny nose Continue Nasonex 2 sprays each nostril once a day as needed for stuffy nose Start azelastine using 1-2 sprays in each nostril once  to twice a day as needed for runny nose/drainage down throat. Let us know if insurance does not cover azelastine nasal spray and we will send in ipratropium bromide May use saline nasal rinse or saline nasal spray as needed to help with nasal symptoms.  Use this prior to any medicated nasal sprays  Reflux Continue omeprazole 20 mg once a day Continue dietary and lifestyle modifications.  Steroid responsive rash Stop triamcinolone 0.1% cream Start triamcinolone 0.5% using 1 application sparingly twice a day as needed to the itchy area on your back. Do not use this on your face, neck, groin, or armpit region  Call the clinic if this treatment plan is not working well for you  Follow up in 2 months or sooner if needed.  Return in about 2 months (around 01/29/2023), or if symptoms worsen or fail to improve.    Thank you for the opportunity to care for this patient.  Please do not hesitate to contact me with questions.  Nehemiah Settle, FNP [facility]

## 2023-02-07 ENCOUNTER — Telehealth: Payer: Self-pay | Admitting: Family

## 2023-02-07 MED ORDER — MONTELUKAST SODIUM 10 MG PO TABS: 10.0000 mg | ORAL_TABLET | Freq: Every day | ORAL | 1 refills | Status: DC

## 2023-02-07 NOTE — Telephone Encounter (Signed)
Patient called and stated she needs the medication montelukast montelukast (SINGULAIR) 10 MG tablet, filled for 90 days not 30 days.

## 2023-02-07 NOTE — Telephone Encounter (Signed)
Sent in 90 day of montelukast to [facility]

## 2023-03-14 ENCOUNTER — Other Ambulatory Visit: Payer: Self-pay | Admitting: Family

## 2023-07-29 ENCOUNTER — Other Ambulatory Visit: Payer: Self-pay | Admitting: Family

## 2023-08-02 ENCOUNTER — Other Ambulatory Visit: Payer: Self-pay | Admitting: Family

## 2023-08-13 ENCOUNTER — Other Ambulatory Visit: Payer: Self-pay | Admitting: Internal Medicine

## 2023-08-14 NOTE — Patient Instructions (Incomplete)
 Moderate persistent asthma-not well-controlled Continue montelukast  10 mg once a day to help prevent cough and wheeze Stop Symbicort  160/4.5 Start Trelegy 200 mcg 1 puff once a day to help prevent cough and wheeze.  Rinse mouth out afterwards.  Samples given along with demonstration Continue Xolair  375 mg injections every 2 weeks at home and have access to an epinephrine  auto-injector set.  Use albuterol  2 puffs every 4 hours as needed for cough or wheeze  Asthma control goals:  Full participation in all desired activities (may need albuterol  before activity) Albuterol  use two time or less a week on average (not counting use with activity) Cough interfering with sleep two time or less a month Oral steroids no more than once a year No hospitalizations   Allergic rhinoconjunctivitis with history of nasal polyposis -We will get lab work to see if you qualify for Dupixent if needed in the future to replace Xolair . We will call you with results once they are back -Start prednisone  10 mg taking 1 tablet twice a day for 4 days and then stop -Recommend scheduling a follow-up with ENT Continue Xyzal  5 mg once a day as needed for runny nose Restart Nasonex  2 sprays each nostril once a day as needed for stuffy nose Continue azelastine  using 1-2 sprays in each nostril once  to twice a day as needed for runny nose/drainage down throat.  May use saline nasal rinse or saline nasal spray as needed to help with nasal symptoms.  Use this prior to any medicated nasal sprays  Reflux Continue omeprazole  20 mg once a day Continue dietary and lifestyle modifications.  Steroid responsive rash Continue  triamcinolone  0.5% using 1 application sparingly twice a day as needed to the itchy area on your back. Do not use this on your face, neck, groin, or armpit region  Call the clinic if this treatment plan is not working well for you  Follow up in 2  months or sooner if needed.

## 2023-08-15 ENCOUNTER — Encounter: Payer: Self-pay | Admitting: Family

## 2023-08-15 ENCOUNTER — Other Ambulatory Visit: Payer: Self-pay

## 2023-08-15 ENCOUNTER — Ambulatory Visit: Admitting: Family

## 2023-08-15 VITALS — BP 128/70 | HR 78 | Temp 97.5°F | Resp 18 | Ht 67.0 in | Wt 212.7 lb

## 2023-08-15 DIAGNOSIS — J309 Allergic rhinitis, unspecified: Secondary | ICD-10-CM | POA: Diagnosis not present

## 2023-08-15 DIAGNOSIS — K219 Gastro-esophageal reflux disease without esophagitis: Secondary | ICD-10-CM

## 2023-08-15 DIAGNOSIS — J4540 Moderate persistent asthma, uncomplicated: Secondary | ICD-10-CM | POA: Diagnosis not present

## 2023-08-15 DIAGNOSIS — J339 Nasal polyp, unspecified: Secondary | ICD-10-CM | POA: Diagnosis not present

## 2023-08-15 DIAGNOSIS — R21 Rash and other nonspecific skin eruption: Secondary | ICD-10-CM

## 2023-08-15 DIAGNOSIS — H1013 Acute atopic conjunctivitis, bilateral: Secondary | ICD-10-CM

## 2023-08-15 DIAGNOSIS — H1010 Acute atopic conjunctivitis, unspecified eye: Secondary | ICD-10-CM

## 2023-08-15 MED ORDER — ALBUTEROL SULFATE HFA 108 (90 BASE) MCG/ACT IN AERS
2.0000 | INHALATION_SPRAY | Freq: Four times a day (QID) | RESPIRATORY_TRACT | 0 refills | Status: AC | PRN
Start: 2023-08-15 — End: ?

## 2023-08-15 MED ORDER — MOMETASONE FUROATE 50 MCG/ACT NA SUSP: NASAL | 1 refills | Status: AC

## 2023-08-15 MED ORDER — AZELASTINE HCL 0.1 % NA SOLN: NASAL | 1 refills | Status: AC

## 2023-08-15 MED ORDER — PREDNISONE 10 MG PO TABS
ORAL_TABLET | ORAL | 0 refills | Status: AC
Start: 2023-08-15 — End: ?

## 2023-08-15 MED ORDER — MONTELUKAST SODIUM 10 MG PO TABS: 10.0000 mg | ORAL_TABLET | Freq: Every day | ORAL | 1 refills | Status: DC

## 2023-08-15 MED ORDER — TRELEGY ELLIPTA 200-62.5-25 MCG/ACT IN AEPB: INHALATION_SPRAY | RESPIRATORY_TRACT | 1 refills | Status: AC

## 2023-08-15 MED ORDER — LEVOCETIRIZINE DIHYDROCHLORIDE 5 MG PO TABS: ORAL_TABLET | ORAL | 1 refills | Status: DC

## 2023-08-15 MED ORDER — EPINEPHRINE 0.3 MG/0.3ML IJ SOAJ: 0.3000 mg | INTRAMUSCULAR | 1 refills | Status: AC | PRN

## 2023-08-15 MED ORDER — LEVALBUTEROL TARTRATE 45 MCG/ACT IN AERO
4.0000 | INHALATION_SPRAY | Freq: Once | RESPIRATORY_TRACT | Status: AC
  Administered 2023-08-15: 4 via RESPIRATORY_TRACT

## 2023-08-15 NOTE — Progress Notes (Signed)
 [address] [place] [address] Dept: [phone]  FOLLOW UP NOTE  Patient ID: Dorothy Baker, female    DOB: 1966-01-18  Age: 58 y.o. MRN: [id number] Date of Office Visit: 08/15/2023  Assessment  Chief Complaint: Follow-up (Refill all meds using rescue inhaler as needed, out meds x 2 months back and forth between [place] and [place] . Pollen and gardening making her "miserable") and Medication Refill  HPI Dorothy Baker is a 58 year old female who presents today for follow-up of nasal polyposis, moderate persistent asthma without complication, allergic rhinoconjunctivitis, rash, and gastroesophageal reflux disease.  She was last seen by myself November 29, 2022.  She denies any new diagnosis or surgery since her last office visit.  Moderate persistent asthma: She reports that she has been taking Symbicort  160/4.5 mcg 2 puffs twice a day with spacer and Xolair  375 mg injection every 2 weeks at home.  She has been out of montelukast  10 mg once a day.  She denies any problems or reactions with the Xolair  and she can tell that the Xolair  injections helped.  There was one time she was a week late in getting her injection and she could tell.  She reports wheezing 2-3 times a week.  She reports that Symbicort  helps better than albuterol .  She denies cough, tightness in chest, shortness of breath, and nocturnal awakenings due to breathing problems.  Since her last office visit she has not required any systemic steroids or made any trips to the emergency room or urgent care due to breathing problems.  Allergic rhinoconjunctivitis with history of nasal polyposis: She has been out of Xyzal  5 mg once a day and has been taking Benadryl.  She has not been using Nasonex  nasal spray, because she thought azelastine  nasal spray took the place of Nasonex .  She does do saline rinses a couple times a day.  She did schedule an appointment with her ENT due to her history of nasal polyps, but for some  reason was not able to make the appointment.  Her last sinus surgery was around 2013 and she reports that she has had 3 sinus surgeries.  She reports rhinorrhea, nasal congestion, and postnasal drip.  She is also not able to taste or smell.  At night what comes out of her nose is clear and sometimes in the morning she will have a little bit of green.  She will also feel some sinus fullness at times.  She has not been treated for any sinus infections since we last saw her.  Reflux is reported as being good.  She continues to take omeprazole  20 mg once a day.  Steroid responsive rash: She reports that this sometimes the rash moves around her back.  She feels like she probably has eczema and should probably see a dermatologist.  She has triamcinolone  0.5% to use as needed.  Triamcinolone  does help.  She reports that she does not use triamcinolone  very often.   Drug Allergies:  Allergies  Allergen Reactions   Aspirin Shortness Of Breath, Other (See Comments) and Cough    Asthma flair up Asthma attack    Other Diarrhea, Nausea And Vomiting and Other (See Comments)    Peanut intolerance   Ciprofloxacin  Other (See Comments)    Muscle aches    Quinolones Other (See Comments)    Questionable - muscle pain    Soy Allergy (Obsolete)     Causes a lot of mucus    Review of Systems: Negative except  as per HPI   Physical Exam: BP 128/70   Pulse 78   Temp (!) 97.5 F (36.4 C) (Temporal)   Resp 18   Ht 5\' 7"  (1.702 m)   Wt 212 lb 11.2 oz (96.5 kg)   SpO2 96%   BMI 33.31 kg/m    Physical Exam Constitutional:      Appearance: Normal appearance.  HENT:     Head: Normocephalic and atraumatic.     Comments: Pharynx normal, eyes normal, ears normal, nose: Bilateral lower turbinates moderately edematous and pale with no drainage noted    Right Ear: Tympanic membrane, ear canal and external ear normal.     Left Ear: Tympanic membrane, ear canal and external ear normal.     Mouth/Throat:      Mouth: Mucous membranes are moist.     Pharynx: Oropharynx is clear.  Eyes:     Conjunctiva/sclera: Conjunctivae normal.  Cardiovascular:     Rate and Rhythm: Regular rhythm.     Heart sounds: Normal heart sounds.  Pulmonary:     Effort: Pulmonary effort is normal.     Breath sounds: Normal breath sounds.     Comments: Lungs clear to auscultation Musculoskeletal:     Cervical back: Neck supple.  Skin:    General: Skin is warm.  Neurological:     Mental Status: She is alert and oriented to person, place, and time.  Psychiatric:        Mood and Affect: Mood normal.        Behavior: Behavior normal.        Thought Content: Thought content normal.        Judgment: Judgment normal.     Diagnostics: FVC 2.28 L (75%), FEV1 1.64 L (69%), FEV1/FVC 0.72.  Spirometry indicates normal respiratory function.  4 puffs of Xopenex  given.  Postbronchodilator response shows FVC 2.35 L (78%), FEV1 1.84 L (77%), FEV1/FVC 0.78.  There is a 12% change in FEV1.  Spirometry indicates normal respiratory function.  Assessment and Plan: 1. Not well controlled moderate persistent asthma   2. Allergic rhinoconjunctivitis   3. Nasal polyposis   4. Gastroesophageal reflux disease, unspecified whether esophagitis present   5. Rash and nonspecific skin eruption     Meds ordered this encounter  Medications   levalbuterol  (XOPENEX  HFA) inhaler 4 puff   Fluticasone-Umeclidin-Vilant (TRELEGY ELLIPTA) 200-62.5-25 MCG/ACT AEPB    Sig: Inhale 1 puff once a day. Rinse mouth out after    Dispense:  90 each    Refill:  1   albuterol  (VENTOLIN  HFA) 108 (90 Base) MCG/ACT inhaler    Sig: Inhale 2 puffs into the lungs every 6 (six) hours as needed for wheezing or shortness of breath.    Dispense:  54 g    Refill:  0    **Patient requests 90 days supply**   azelastine  (ASTELIN ) 0.1 % nasal spray    Sig: Use 1 to 2 sprays in each nostril once to twice a day as needed for runny nose/drainage down throat    Dispense:   90 mL    Refill:  1   EPINEPHrine  0.3 mg/0.3 mL IJ SOAJ injection    Sig: Inject 0.3 mg into the muscle as needed for anaphylaxis.    Dispense:  1 each    Refill:  1   levocetirizine (XYZAL ) 5 MG tablet    Sig: TAKE 1 TABLET(5 MG) BY MOUTH EVERY EVENING    Dispense:  90 tablet    Refill:  1   montelukast  (SINGULAIR ) 10 MG tablet    Sig: Take 1 tablet (10 mg total) by mouth at bedtime.    Dispense:  90 tablet    Refill:  1   mometasone  (NASONEX ) 50 MCG/ACT nasal spray    Sig: SHAKE LIQUID AND USE 1 SPRAY IN EACH NOSTRIL TWICE DAILY    Dispense:  30 mL    Refill:  1    Patient requests 90 day supply   predniSONE  (DELTASONE ) 10 MG tablet    Sig: Take 1 tablet once a day for 4 days and then stop    Dispense:  4 tablet    Refill:  0    Patient Instructions  Moderate persistent asthma-not well-controlled Continue montelukast  10 mg once a day to help prevent cough and wheeze Stop Symbicort  160/4.5 Start Trelegy 200 mcg 1 puff once a day to help prevent cough and wheeze.  Rinse mouth out afterwards.  Samples given along with demonstration Continue Xolair  375 mg injections every 2 weeks at home and have access to an epinephrine  auto-injector set.  Use albuterol  2 puffs every 4 hours as needed for cough or wheeze  Asthma control goals:  Full participation in all desired activities (may need albuterol  before activity) Albuterol  use two time or less a week on average (not counting use with activity) Cough interfering with sleep two time or less a month Oral steroids no more than once a year No hospitalizations   Allergic rhinoconjunctivitis with history of nasal polyposis -We will get lab work to see if you qualify for Dupixent if needed in the future to replace Xolair . We will call you with results once they are back -Start prednisone  10 mg taking 1 tablet twice a day for 4 days and then stop -Recommend scheduling a follow-up with ENT Continue Xyzal  5 mg once a day as needed for  runny nose Restart Nasonex  2 sprays each nostril once a day as needed for stuffy nose Continue azelastine  using 1-2 sprays in each nostril once  to twice a day as needed for runny nose/drainage down throat.  May use saline nasal rinse or saline nasal spray as needed to help with nasal symptoms.  Use this prior to any medicated nasal sprays  Reflux Continue omeprazole  20 mg once a day Continue dietary and lifestyle modifications.  Steroid responsive rash Continue  triamcinolone  0.5% using 1 application sparingly twice a day as needed to the itchy area on your back. Do not use this on your face, neck, groin, or armpit region  Call the clinic if this treatment plan is not working well for you  Follow up in 2  months or sooner if needed.  Return in about 2 months (around 10/15/2023).    Thank you for the opportunity to care for this patient.  Please do not hesitate to contact me with questions.  Tinnie Forehand, FNP [facility] 

## 2023-08-16 LAB — CBC WITH DIFFERENTIAL/PLATELET
Basophils Absolute: 0.1 10*3/uL (ref 0.0–0.2)
Basos: 1 %
EOS (ABSOLUTE): 0.5 10*3/uL — ABNORMAL HIGH (ref 0.0–0.4)
Eos: 7 %
Hematocrit: 38.2 % (ref 34.0–46.6)
Hemoglobin: 12.1 g/dL (ref 11.1–15.9)
Immature Grans (Abs): 0 10*3/uL (ref 0.0–0.1)
Immature Granulocytes: 0 %
Lymphocytes Absolute: 2.6 10*3/uL (ref 0.7–3.1)
Lymphs: 40 %
MCH: 27 pg (ref 26.6–33.0)
MCHC: 31.7 g/dL (ref 31.5–35.7)
MCV: 85 fL (ref 79–97)
Monocytes Absolute: 0.5 10*3/uL (ref 0.1–0.9)
Monocytes: 8 %
Neutrophils Absolute: 2.9 10*3/uL (ref 1.4–7.0)
Neutrophils: 44 %
Platelets: 300 10*3/uL (ref 150–450)
RBC: 4.48 x10E6/uL (ref 3.77–5.28)
RDW: 14.5 % (ref 11.7–15.4)
WBC: 6.6 10*3/uL (ref 3.4–10.8)

## 2023-08-17 NOTE — Progress Notes (Signed)
 Please let Dorothy Baker know that her lab work came back.   CBC with diff shows elevated eosinophils ( AEC: 500).This means that she would be a candidate for Dupixent if needed rather than Xolair . Dupixent would be helpful for her asthma and nasal polyps.

## 2023-08-18 ENCOUNTER — Encounter: Payer: Self-pay | Admitting: *Deleted

## 2023-11-10 ENCOUNTER — Other Ambulatory Visit: Payer: Self-pay | Admitting: Internal Medicine

## 2023-11-10 NOTE — Telephone Encounter (Signed)
 Rx refill Xolair .

## 2023-11-12 ENCOUNTER — Other Ambulatory Visit: Payer: Self-pay | Admitting: Family

## 2024-01-12 ENCOUNTER — Telehealth: Payer: Self-pay

## 2024-01-12 NOTE — Telephone Encounter (Signed)
 Per patients last note she will need to try and fail nasal steroid for 4 weeks. Both allergy and ent note indicates not using nasal spray. She will need to come back in 4 weeks after starting nasal steroid for updated visit and documentation to failure for nasal steroids and continue on the nasal spray while on dupixent

## 2024-01-12 NOTE — Telephone Encounter (Signed)
 fYI from [facility]

## 2024-01-12 NOTE — Telephone Encounter (Signed)
 Please update Katrianna on Tammy's message.  Please have her start her nasal steroid spray for 4 weeks and schedule an appointment with Dr. Marinda after the 4 weeks

## 2024-01-12 NOTE — Telephone Encounter (Signed)
 Copying Dorothy Baker because she saw her last and it looks like this was discussed. Not sure where she is at in that process.

## 2024-01-12 NOTE — Telephone Encounter (Signed)
 When I last saw her she was not interested in switching to Dupixent due to nasal polyposis/asthma.  So we have not started this process. If she is going to stop Xolair  and change to Dupixent she needs to schedule a follow up appointment with Dr. Marinda 2-3 months after starting Dupixent injections.

## 2024-01-13 NOTE — Telephone Encounter (Signed)
 Okay, pt was called Dorothy Baker to call office, also My Chart msg was sent.

## 2024-01-13 NOTE — Telephone Encounter (Signed)
Thanks Pauline :)

## 2024-01-16 ENCOUNTER — Encounter: Payer: Self-pay | Admitting: Internal Medicine

## 2024-01-16 ENCOUNTER — Ambulatory Visit: Admitting: Internal Medicine

## 2024-01-16 ENCOUNTER — Other Ambulatory Visit: Payer: Self-pay

## 2024-01-16 VITALS — BP 130/76 | HR 79 | Temp 97.9°F | Resp 18 | Wt 213.7 lb

## 2024-01-16 DIAGNOSIS — J4540 Moderate persistent asthma, uncomplicated: Secondary | ICD-10-CM

## 2024-01-16 DIAGNOSIS — J339 Nasal polyp, unspecified: Secondary | ICD-10-CM | POA: Diagnosis not present

## 2024-01-16 DIAGNOSIS — J309 Allergic rhinitis, unspecified: Secondary | ICD-10-CM

## 2024-01-16 DIAGNOSIS — H1013 Acute atopic conjunctivitis, bilateral: Secondary | ICD-10-CM

## 2024-01-16 DIAGNOSIS — K219 Gastro-esophageal reflux disease without esophagitis: Secondary | ICD-10-CM

## 2024-01-16 DIAGNOSIS — H1010 Acute atopic conjunctivitis, unspecified eye: Secondary | ICD-10-CM

## 2024-01-16 NOTE — Patient Instructions (Addendum)
 Moderate persistent asthma-controlled on Xolair  Continue montelukast  10 mg once a day to help prevent cough and wheeze Continue Trelegy 200 mcg 1 puff once a day to help prevent cough and wheeze.  Rinse mouth out afterwards Continue Xolair  375 mg injections every 2 weeks at home and have access to an epinephrine  auto-injector set. ----  - will attempt switching to dupixent for nasal polyps and asthma, continue Xolair  until approved. Use albuterol  2 puffs every 4 hours as needed for cough or wheeze  Asthma control goals:  Full participation in all desired activities (may need albuterol  before activity) Albuterol  use two time or less a week on average (not counting use with activity) Cough interfering with sleep two time or less a month Oral steroids no more than once a year No hospitalizations   Allergic rhinoconjunctivitis with history of nasal polyposis-not controlled Anosmia improved with systemic steroids Nasal polyp visible Failed prolonged use of nasal steroids Continue Xyzal  5 mg once a day as needed for runny nose Continue Flonase 2 sprays each nostril once a day as needed for stuffy nose Continue azelastine  using 1-2 sprays in each nostril once  to twice a day as needed for runny nose/drainage down throat.  May use saline nasal rinse or saline nasal spray as needed to help with nasal symptoms.  Use this prior to any medicated nasal sprays  Reflux-controlled Continue omeprazole  20 mg once a day Continue dietary and lifestyle modifications.  Call the clinic if this treatment plan is not working well for you  Follow up in 6 months, sooner if needed.  It was a pleasure seeing you again in clinic today! Thank you for allowing me to participate in your care.  Rocky Endow, MD [facility]

## 2024-01-16 NOTE — Progress Notes (Signed)
 FOLLOW UP Date of Service/Encounter:   01/16/2024  Subjective:  Dorothy Baker (DOB: July 26, 1965) is a 58 y.o. female who [facility] in re-evaluation of the following: acute visit to discuss switching to dupixent from xolair  due to uncontrolled nasal polys History obtained from: chart review and patient.  Today presents for follow-up. Discussed the use of AI scribe software for clinical note transcription with the patient, who gave verbal consent to proceed.  History of Present Illness Dorothy Baker is a 58 year old female with nasal polyps who presents for documentation of nasal steroid use and consideration of switching to Dupixent.  Nasal polyposis and associated symptoms - Persistent nasal polyps despite ongoing therapy - No relief with over four weeks of Flonase nasal spray use - she has been purchasing flonase over the counter and despite compliance with use, symptoms persist - lost sense of smell prompting ENT evaluatoin - Temporary improvement in sense of smell with two-week course of prednisone  prescribed by ENT - ENT evaluation two weeks ago confirmed presence of nasal polyps  Prior and current pharmacologic management - Flonase nasal spray used for at least four weeks without symptomatic improvement - Xolair  used for approximately eleven years, initially for asthma; not effective for nasal polyps as polyps persisted during therapy - Two-week course of prednisone  provided temporary symptomatic relief, including return of sense of smell - ENT recommended consideration of Dupixent due to inadequate response to Xolair  for nasal polyps   All medications reviewed by clinical staff and updated in chart. No new pertinent medical or surgical history except as noted in HPI.  ROS: All others negative except as noted per HPI.   Objective:  BP 130/76   Pulse 79   Temp 97.9 F (36.6 C) (Temporal)   Resp 18   Wt 213  lb 11.2 oz (96.9 kg)   SpO2 96%   BMI 33.47 kg/m  Body mass index is 33.47 kg/m. Physical Exam: General Appearance:  Alert, cooperative, no distress, appears stated age  Head:  Normocephalic, without obvious abnormality, atraumatic  Eyes:  Conjunctiva clear, EOM's intact  Ears EACs normal bilaterally and normal TMs bilaterally  Nose: Nares normal, visible nasal polyp in right nare at level of inferior turbinate and hypertrophic turbinates  Throat: Lips, tongue normal; teeth and gums normal, normal posterior oropharynx  Neck: Supple, symmetrical  Lungs:   clear to auscultation bilaterally, Respirations unlabored, no coughing  Heart:  regular rate and rhythm and no murmur, Appears well perfused  Extremities: No edema  Skin: Skin color, texture, turgor normal and no rashes or lesions on visualized portions of skin  Neurologic: No gross deficits   Labs:  Lab Orders  No laboratory test(s) ordered today    Assessment/Plan   Moderate persistent asthma-controlled on Xolair  Continue montelukast  10 mg once a day to help prevent cough and wheeze Continue Trelegy 200 mcg 1 puff once a day to help prevent cough and wheeze.  Rinse mouth out afterwards Continue Xolair  375 mg injections every 2 weeks at home and have access to an epinephrine  auto-injector set. ----  - will attempt switching to dupixent for nasal polyps and asthma, continue Xolair  until approved. Use albuterol  2 puffs every 4 hours as needed for cough or wheeze  Asthma control goals:  Full participation in all desired activities (may need albuterol  before activity) Albuterol  use two time or less a week on average (not counting use with activity) Cough interfering with sleep  two time or less a month Oral steroids no more than once a year No hospitalizations   Allergic rhinoconjunctivitis with history of nasal polyposis-not controlled Anosmia improved with systemic steroids Nasal polyp visible Failed prolonged use of nasal  steroids Continue Xyzal  5 mg once a day as needed for runny nose Continue Flonase 2 sprays each nostril once a day as needed for stuffy nose Continue azelastine  using 1-2 sprays in each nostril once  to twice a day as needed for runny nose/drainage down throat.  May use saline nasal rinse or saline nasal spray as needed to help with nasal symptoms.  Use this prior to any medicated nasal sprays  Reflux-controlled Continue omeprazole  20 mg once a day Continue dietary and lifestyle modifications.  Call the clinic if this treatment plan is not working well for you  Follow up in 6 months, sooner if needed.  It was a pleasure seeing you again in clinic today! Thank you for allowing me to participate in your care.  Rocky Endow, MD [facility]  Other: none  Rocky Endow, MD  [facility] 

## 2024-02-02 ENCOUNTER — Telehealth: Payer: Self-pay | Admitting: *Deleted

## 2024-02-02 NOTE — Telephone Encounter (Signed)
-----   Message from Dorothy Baker Endow sent at 01/16/2024 12:39 PM EDT ----- Erskin Jupiter Kabir-can we look into starting Dupixent for her for her asthma and nasal polyps.  She has been using Flonase purchased over-the-counter for greater than 4 weeks.

## 2024-02-02 NOTE — Telephone Encounter (Signed)
 L/m for patient to contact me to advise approval, copay card and submit to [facility] for Dupixent

## 2024-02-03 MED ORDER — DUPIXENT 300 MG/2ML ~~LOC~~ SOSY: 300.0000 mg | PREFILLED_SYRINGE | SUBCUTANEOUS | 11 refills | Status: AC

## 2024-02-03 NOTE — Telephone Encounter (Signed)
 Spoke to patient and advised submit to [facility] with delivery, storage and dosing instructions for same

## 2024-02-10 ENCOUNTER — Other Ambulatory Visit: Payer: Self-pay | Admitting: Family

## 2024-05-08 ENCOUNTER — Other Ambulatory Visit: Payer: Self-pay | Admitting: Family
